# Patient Record
Sex: Female | Born: 1970 | Race: White | Hispanic: No | Marital: Married | Smoking: Never smoker
Health system: Southern US, Community
[De-identification: ages and names within clinical notes are randomized; demographics above are authoritative.]

## PROBLEM LIST (undated history)

## (undated) DIAGNOSIS — I340 Nonrheumatic mitral (valve) insufficiency: Secondary | ICD-10-CM

## (undated) DIAGNOSIS — F419 Anxiety disorder, unspecified: Secondary | ICD-10-CM

## (undated) DIAGNOSIS — E785 Hyperlipidemia, unspecified: Secondary | ICD-10-CM

## (undated) DIAGNOSIS — D0510 Intraductal carcinoma in situ of unspecified breast: Secondary | ICD-10-CM

## (undated) DIAGNOSIS — N8500 Endometrial hyperplasia, unspecified: Secondary | ICD-10-CM

## (undated) DIAGNOSIS — T7840XA Allergy, unspecified, initial encounter: Secondary | ICD-10-CM

## (undated) DIAGNOSIS — R112 Nausea with vomiting, unspecified: Secondary | ICD-10-CM

## (undated) DIAGNOSIS — C50919 Malignant neoplasm of unspecified site of unspecified female breast: Secondary | ICD-10-CM

## (undated) DIAGNOSIS — T4145XA Adverse effect of unspecified anesthetic, initial encounter: Secondary | ICD-10-CM

## (undated) DIAGNOSIS — T8859XA Other complications of anesthesia, initial encounter: Secondary | ICD-10-CM

## (undated) DIAGNOSIS — K219 Gastro-esophageal reflux disease without esophagitis: Secondary | ICD-10-CM

## (undated) DIAGNOSIS — Z9889 Other specified postprocedural states: Secondary | ICD-10-CM

## (undated) DIAGNOSIS — I272 Pulmonary hypertension, unspecified: Secondary | ICD-10-CM

## (undated) DIAGNOSIS — I10 Essential (primary) hypertension: Secondary | ICD-10-CM

## (undated) DIAGNOSIS — K429 Umbilical hernia without obstruction or gangrene: Secondary | ICD-10-CM

## (undated) DIAGNOSIS — Z923 Personal history of irradiation: Secondary | ICD-10-CM

## (undated) HISTORY — DX: Endometrial hyperplasia, unspecified: N85.00

## (undated) HISTORY — DX: Intraductal carcinoma in situ of unspecified breast: D05.10

## (undated) HISTORY — DX: Essential (primary) hypertension: I10

## (undated) HISTORY — DX: Pulmonary hypertension, unspecified: I27.20

## (undated) HISTORY — DX: Umbilical hernia without obstruction or gangrene: K42.9

## (undated) HISTORY — PX: BREAST EXCISIONAL BIOPSY: SUR124

## (undated) HISTORY — DX: Allergy, unspecified, initial encounter: T78.40XA

## (undated) HISTORY — DX: Hyperlipidemia, unspecified: E78.5

## (undated) HISTORY — PX: UMBILICAL HERNIA REPAIR: SHX196

## (undated) HISTORY — DX: Nonrheumatic mitral (valve) insufficiency: I34.0

## (undated) HISTORY — DX: Malignant neoplasm of unspecified site of unspecified female breast: C50.919

---

## 1998-08-27 ENCOUNTER — Inpatient Hospital Stay (HOSPITAL_COMMUNITY): Admission: AD | Admit: 1998-08-27 | Discharge: 1998-08-29 | Payer: Self-pay | Admitting: Obstetrics & Gynecology

## 2001-01-02 ENCOUNTER — Other Ambulatory Visit: Admission: RE | Admit: 2001-01-02 | Discharge: 2001-01-02 | Payer: Self-pay | Admitting: *Deleted

## 2002-01-08 ENCOUNTER — Other Ambulatory Visit: Admission: RE | Admit: 2002-01-08 | Discharge: 2002-01-08 | Payer: Self-pay | Admitting: *Deleted

## 2002-04-14 ENCOUNTER — Encounter: Payer: Self-pay | Admitting: *Deleted

## 2002-04-14 ENCOUNTER — Encounter: Admission: RE | Admit: 2002-04-14 | Discharge: 2002-04-14 | Payer: Self-pay | Admitting: *Deleted

## 2003-02-04 ENCOUNTER — Other Ambulatory Visit: Admission: RE | Admit: 2003-02-04 | Discharge: 2003-02-04 | Payer: Self-pay | Admitting: *Deleted

## 2003-04-30 HISTORY — PX: AUGMENTATION MAMMAPLASTY: SUR837

## 2003-04-30 HISTORY — PX: BREAST ENHANCEMENT SURGERY: SHX7

## 2003-08-03 ENCOUNTER — Encounter: Admission: RE | Admit: 2003-08-03 | Discharge: 2003-08-03 | Payer: Self-pay | Admitting: *Deleted

## 2004-03-02 ENCOUNTER — Other Ambulatory Visit: Admission: RE | Admit: 2004-03-02 | Discharge: 2004-03-02 | Payer: Self-pay | Admitting: Obstetrics and Gynecology

## 2004-11-23 ENCOUNTER — Other Ambulatory Visit: Admission: RE | Admit: 2004-11-23 | Discharge: 2004-11-23 | Payer: Self-pay | Admitting: Obstetrics and Gynecology

## 2005-05-31 ENCOUNTER — Other Ambulatory Visit: Admission: RE | Admit: 2005-05-31 | Discharge: 2005-05-31 | Payer: Self-pay | Admitting: Obstetrics and Gynecology

## 2006-06-16 ENCOUNTER — Encounter: Admission: RE | Admit: 2006-06-16 | Discharge: 2006-06-16 | Payer: Self-pay | Admitting: Obstetrics and Gynecology

## 2007-12-30 ENCOUNTER — Encounter: Admission: RE | Admit: 2007-12-30 | Discharge: 2007-12-30 | Payer: Self-pay | Admitting: Obstetrics and Gynecology

## 2009-03-06 ENCOUNTER — Ambulatory Visit: Payer: Self-pay | Admitting: Family Medicine

## 2009-03-06 DIAGNOSIS — E7800 Pure hypercholesterolemia, unspecified: Secondary | ICD-10-CM | POA: Insufficient documentation

## 2009-03-06 DIAGNOSIS — R5381 Other malaise: Secondary | ICD-10-CM | POA: Insufficient documentation

## 2009-03-06 DIAGNOSIS — K219 Gastro-esophageal reflux disease without esophagitis: Secondary | ICD-10-CM | POA: Insufficient documentation

## 2009-03-06 DIAGNOSIS — R5383 Other fatigue: Secondary | ICD-10-CM

## 2009-03-06 DIAGNOSIS — J069 Acute upper respiratory infection, unspecified: Secondary | ICD-10-CM | POA: Insufficient documentation

## 2009-03-06 DIAGNOSIS — I10 Essential (primary) hypertension: Secondary | ICD-10-CM | POA: Insufficient documentation

## 2009-03-07 ENCOUNTER — Encounter (INDEPENDENT_AMBULATORY_CARE_PROVIDER_SITE_OTHER): Payer: Self-pay | Admitting: *Deleted

## 2009-03-07 LAB — CONVERTED CEMR LAB
ALT: 21 units/L (ref 0–35)
AST: 29 units/L (ref 0–37)
Alkaline Phosphatase: 73 units/L (ref 39–117)
CO2: 28 meq/L (ref 19–32)
Calcium: 9.1 mg/dL (ref 8.4–10.5)
Creatinine, Ser: 0.9 mg/dL (ref 0.4–1.2)
Direct LDL: 129.4 mg/dL
Eosinophils Relative: 1.1 % (ref 0.0–5.0)
GFR calc non Af Amer: 73.51 mL/min (ref >60)
HCT: 39 % (ref 36.0–46.0)
Hemoglobin: 13.2 g/dL (ref 12.0–15.0)
MCV: 92.3 fL (ref 78.0–100.0)
RBC: 4.22 M/uL (ref 3.87–5.11)
Sodium: 141 meq/L (ref 135–145)
Total CHOL/HDL Ratio: 3
WBC: 11.2 10*3/uL — ABNORMAL HIGH (ref 4.5–10.5)

## 2009-04-29 DIAGNOSIS — Z923 Personal history of irradiation: Secondary | ICD-10-CM

## 2009-04-29 DIAGNOSIS — D0510 Intraductal carcinoma in situ of unspecified breast: Secondary | ICD-10-CM

## 2009-04-29 HISTORY — PX: BREAST LUMPECTOMY: SHX2

## 2009-04-29 HISTORY — DX: Personal history of irradiation: Z92.3

## 2009-04-29 HISTORY — DX: Intraductal carcinoma in situ of unspecified breast: D05.10

## 2009-09-01 ENCOUNTER — Encounter: Admission: RE | Admit: 2009-09-01 | Discharge: 2009-09-01 | Payer: Self-pay | Admitting: Obstetrics and Gynecology

## 2009-09-04 ENCOUNTER — Ambulatory Visit: Payer: Self-pay | Admitting: Oncology

## 2009-09-05 ENCOUNTER — Encounter: Payer: Self-pay | Admitting: Family Medicine

## 2009-09-06 LAB — CBC WITH DIFFERENTIAL/PLATELET
LYMPH%: 20.3 % (ref 14.0–49.7)
MCV: 91.4 fL (ref 79.5–101.0)
MONO%: 10.3 % (ref 0.0–14.0)
NEUT%: 65.9 % (ref 38.4–76.8)
RDW: 14 % (ref 11.2–14.5)
lymph#: 1.5 10*3/uL (ref 0.9–3.3)

## 2009-09-08 ENCOUNTER — Encounter: Admission: RE | Admit: 2009-09-08 | Discharge: 2009-09-08 | Payer: Self-pay | Admitting: Obstetrics and Gynecology

## 2009-09-14 ENCOUNTER — Encounter: Admission: RE | Admit: 2009-09-14 | Discharge: 2009-09-14 | Payer: Self-pay | Admitting: Obstetrics and Gynecology

## 2009-09-15 ENCOUNTER — Ambulatory Visit: Payer: Self-pay | Admitting: Family Medicine

## 2009-09-18 LAB — CONVERTED CEMR LAB: Triglycerides: 69 mg/dL (ref 0.0–149.0)

## 2009-09-21 ENCOUNTER — Ambulatory Visit: Payer: Self-pay | Admitting: Family Medicine

## 2009-09-21 DIAGNOSIS — D0590 Unspecified type of carcinoma in situ of unspecified breast: Secondary | ICD-10-CM | POA: Insufficient documentation

## 2009-09-21 DIAGNOSIS — M722 Plantar fascial fibromatosis: Secondary | ICD-10-CM | POA: Insufficient documentation

## 2009-10-09 ENCOUNTER — Encounter: Admission: RE | Admit: 2009-10-09 | Discharge: 2009-10-09 | Payer: Self-pay | Admitting: Obstetrics and Gynecology

## 2009-10-09 ENCOUNTER — Ambulatory Visit (HOSPITAL_BASED_OUTPATIENT_CLINIC_OR_DEPARTMENT_OTHER): Admission: RE | Admit: 2009-10-09 | Discharge: 2009-10-09 | Payer: Self-pay | Admitting: Surgery

## 2009-10-17 ENCOUNTER — Ambulatory Visit: Admission: RE | Admit: 2009-10-17 | Discharge: 2010-01-04 | Payer: Self-pay | Admitting: Radiation Oncology

## 2009-10-18 ENCOUNTER — Encounter: Payer: Self-pay | Admitting: Family Medicine

## 2010-01-03 ENCOUNTER — Encounter: Payer: Self-pay | Admitting: Family Medicine

## 2010-01-17 ENCOUNTER — Ambulatory Visit: Payer: Self-pay | Admitting: Oncology

## 2010-01-19 LAB — CBC WITH DIFFERENTIAL/PLATELET
BASO%: 1.8 % (ref 0.0–2.0)
Basophils Absolute: 0.1 10*3/uL (ref 0.0–0.1)
Eosinophils Absolute: 0.1 10*3/uL (ref 0.0–0.5)
HCT: 38.1 % (ref 34.8–46.6)
HGB: 12.6 g/dL (ref 11.6–15.9)
LYMPH%: 13.5 % — ABNORMAL LOW (ref 14.0–49.7)
MCH: 30 pg (ref 25.1–34.0)
MCV: 90.8 fL (ref 79.5–101.0)
MONO#: 0.6 10*3/uL (ref 0.1–0.9)
MONO%: 14.5 % — ABNORMAL HIGH (ref 0.0–14.0)
NEUT%: 67.9 % (ref 38.4–76.8)
Platelets: 192 10*3/uL (ref 145–400)
RBC: 4.19 10*6/uL (ref 3.70–5.45)
lymph#: 0.5 10*3/uL — ABNORMAL LOW (ref 0.9–3.3)

## 2010-04-20 ENCOUNTER — Encounter: Payer: Self-pay | Admitting: Family Medicine

## 2010-04-27 ENCOUNTER — Ambulatory Visit: Payer: Self-pay | Admitting: Oncology

## 2010-05-04 LAB — COMPREHENSIVE METABOLIC PANEL
ALT: 19 U/L (ref 0–35)
AST: 22 U/L (ref 0–37)
Albumin: 3.7 g/dL (ref 3.5–5.2)
Alkaline Phosphatase: 55 U/L (ref 39–117)
BUN: 12 mg/dL (ref 6–23)
CO2: 29 mEq/L (ref 19–32)
Calcium: 9.1 mg/dL (ref 8.4–10.5)
Chloride: 105 mEq/L (ref 96–112)
Creatinine, Ser: 0.9 mg/dL (ref 0.40–1.20)
Glucose, Bld: 98 mg/dL (ref 70–99)
Potassium: 4.1 mEq/L (ref 3.5–5.3)
Sodium: 141 mEq/L (ref 135–145)
Total Bilirubin: 0.3 mg/dL (ref 0.3–1.2)
Total Protein: 6.7 g/dL (ref 6.0–8.3)

## 2010-05-04 LAB — CBC WITH DIFFERENTIAL/PLATELET
BASO%: 0.6 % (ref 0.0–2.0)
Basophils Absolute: 0 10*3/uL (ref 0.0–0.1)
EOS%: 1.4 % (ref 0.0–7.0)
Eosinophils Absolute: 0.1 10*3/uL (ref 0.0–0.5)
HCT: 36.8 % (ref 34.8–46.6)
HGB: 12.5 g/dL (ref 11.6–15.9)
LYMPH%: 18.3 % (ref 14.0–49.7)
MCH: 31.2 pg (ref 25.1–34.0)
MCHC: 33.8 g/dL (ref 31.5–36.0)
MCV: 92.2 fL (ref 79.5–101.0)
MONO#: 0.4 10*3/uL (ref 0.1–0.9)
MONO%: 7.3 % (ref 0.0–14.0)
NEUT#: 4.4 10*3/uL (ref 1.5–6.5)
NEUT%: 72.4 % (ref 38.4–76.8)
Platelets: 160 10*3/uL (ref 145–400)
RBC: 3.99 10*6/uL (ref 3.70–5.45)
RDW: 13.6 % (ref 11.2–14.5)
WBC: 6.1 10*3/uL (ref 3.9–10.3)
lymph#: 1.1 10*3/uL (ref 0.9–3.3)

## 2010-05-31 NOTE — Letter (Signed)
Summary: [facility]   Imported By: Lanelle Bal 10/03/2009 10:41:55  _____________________________________________________________________  External Attachment:    Type:   Image     Comment:   External Document

## 2010-05-31 NOTE — Letter (Signed)
Summary: [facility]  [facility]   Imported By: Maryln Gottron 01/26/2010 12:40:36  _____________________________________________________________________  External Attachment:    Type:   Image     Comment:   External Document

## 2010-05-31 NOTE — Letter (Signed)
Summary: [facility]   Imported By: Lanelle Bal 05/15/2010 08:19:07  _____________________________________________________________________  External Attachment:    Type:   Image     Comment:   External Document

## 2010-05-31 NOTE — Letter (Signed)
[facility]   Imported By: Maryln Gottron 11/14/2009 15:49:56  _____________________________________________________________________  External Attachment:    Type:   Image     Comment:   External Document

## 2010-05-31 NOTE — Assessment & Plan Note (Signed)
Summary: follow up bp and chol/hmw   Vital Signs:  Patient profile:   40 year old female Height:      67 inches Weight:      166.6 pounds BMI:     26.19 Temp:     97.8 degrees F oral Pulse rate:   80 / minute Pulse rhythm:   regular BP sitting:   120 / 80  (left arm) Cuff size:   regular  Vitals Entered By: Benny Lennert CMA Duncan Dull) (Sep 21, 2009 8:36 AM)  History of Present Illness: Chief complaint follow up bp and cholesterol  HTN, stable  Chol, labs reviewed  BRCA:  Mammogram, breast center.  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  Doing the gene testing.  Seeing Dr. Ezzard Standing and  ALready has seen them.   The patient presents with a multimonth long history of heel pain. This is notable for worsening pain first thing in the morning when arising and standing after sitting.   Prior foot or ankle fractures: none Prior operations: none Orthotics or bracing: none PT or home rehab: none Night splints: no Ice massage: no Ball massage: no  Metatarsal pain: no   Allergies: 1)  ! Penicillin 2)  ! Sulfa 3)  ! Sulfa  Past History:  Past medical, surgical, family and social histories (including risk factors) reviewed, and no changes noted (except as noted below).  Past Medical History: HYPERCHOLESTEROLEMIA GERD Hypertension Breast Cancer, DCIS  Past Surgical History: Reviewed history from 03/06/2009 and no changes required. child birth 1997&2000  Family History: Reviewed history from 03/06/2009 and no changes required. Family History Breast cancer 1st degree relative <50 Family History High cholesterol Family History Hypertension Family History of Stroke F 1st degree relative <60 Family History of Stroke M 1st degree relative <50 Family History of Cardiovascular disorder  Social History: Reviewed history from 03/06/2009 and no changes required. Occupation: Dental hygenist Married, 2 children 13, 10 Never Smoked Alcohol use-yes,  occ Drug use-no Regular exercise-yes  Review of Systems      See HPI General:  Denies chills, fatigue, and fever.  Physical Exam  Additional Exam:  GEN: WDWN, NAD, Non-toxic, A & O x 3 HEENT: Atraumatic, Normocephalic. Neck supple. No masses, No LAD. Ears and Nose: No external deformity. CV: RRR, No M/G/R. No JVD. No thrill. No extra heart sounds. PULM: CTA B, no wheezes, crackles, rhonchi. No retractions. No resp. distress. No accessory muscle use. EXTR: No c/c/e NEURO: Normal gait.  PSYCH: Normally interactive. Conversant. Not depressed or anxious appearing.  Calm demeanor.    Echymosis: no Edema: no ROM: full LE B Gait: heel toe, non-antalgic MT pain: no Callus pattern: none Lateral Mall: NT Medial Mall: NT Talus: NT Navicular: NT Calcaneous: NT Metatarsals: NT 5th MT: NT Phalanges: NT Achilles: NT Plantar Fascia: tender, medial along PF. Pain with forced dorsi Fat Pad: NT Peroneals: NT Post Tib: NT Great Toe: Nml motion Ant Drawer: neg Other foot breakdown: none Long arch: preserved Transverse arch: preserved Hindfoot breakdown: none Sensation: intact      Impression & Recommendations:  Problem # 1:  HYPERCHOLESTEROLEMIA (ICD-272.0) Assessment Improved  Labs Reviewed: SGOT: 29 (03/06/2009)   SGPT: 21 (03/06/2009)   HDL:89.30 (09/15/2009), 94.70 (03/06/2009)  Chol:230 (09/15/2009), 241 (03/06/2009)  Trig:69.0 (09/15/2009), 90.0 (03/06/2009)  Problem # 2:  HYPERTENSION (ICD-401.9)  Her updated medication list for this problem includes:    Lisinopril 10 Mg Tabs (Lisinopril) .Marland Kitchen... 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 Reviewed footwear. Rigid soles have been shown to help with  PF. Reviewed rehab of stretching and calf raises.   Complete Medication List: 1)  Lisinopril 10 Mg Tabs (Lisinopril) .... Take 1 tab by mouth daily  Patient Instructions: 1)  Please read handouts from AAOSM and American Academy of Foot and Ankle Surgeons on Plantar Fascitis. 2)  STRETCHING and Strengthening program critically important. 3)  Strengthening on foot and calf muscles as seen in handout. 4)  Calf raises, 2 legged, then 1 legged. 5)  Foot massage with tennis ball. 6)  Ice massage. 7)  NEEDS TO BE DONE EVERY DAY 8)  Recommended over the counter insoles. (Spenco or Hapad) 9)  A rigid shoe with good arch support helps: Dansko (great), Randel Pigg, Merrell 10)  No easily bendable shoes.    Prescriptions: LISINOPRIL 10 MG  TABS (LISINOPRIL) Take 1 tab by mouth daily  #30 x 11   Entered and Authorized by:   [name] MD   Signed by:   [name] MD on 09/21/2009   Method used:   Print then Give to Patient   RxID:   [id number]   Current Allergies (reviewed today): ! PENICILLIN ! SULFA ! SULFA

## 2010-06-20 ENCOUNTER — Other Ambulatory Visit: Payer: Self-pay | Admitting: Obstetrics and Gynecology

## 2010-06-20 DIAGNOSIS — Z9889 Other specified postprocedural states: Secondary | ICD-10-CM

## 2010-06-25 ENCOUNTER — Ambulatory Visit (INDEPENDENT_AMBULATORY_CARE_PROVIDER_SITE_OTHER): Payer: BC Managed Care – PPO | Admitting: Sports Medicine

## 2010-06-25 ENCOUNTER — Encounter: Payer: Self-pay | Admitting: Sports Medicine

## 2010-06-25 DIAGNOSIS — M79609 Pain in unspecified limb: Secondary | ICD-10-CM

## 2010-06-25 DIAGNOSIS — M722 Plantar fascial fibromatosis: Secondary | ICD-10-CM

## 2010-07-05 NOTE — Assessment & Plan Note (Signed)
Summary: NP,PF,MC [phone]   Vital Signs:  Patient profile:   40 year old female Height:      66.5 inches Weight:      165 pounds BMI:     26.33 Pulse rate:   68 / minute BP sitting:   121 / 80  (right arm)  Vitals Entered By: Rochele Pages RN (June 25, 2010 11:18 AM) CC: bilat PF   CC:  bilat PF.  History of Present Illness: New pt that presents to clinic for evaluation of PF which she has experienced for greater than 1 yr.  Heel pain- worse in mornings and after rest.  Has discussed with Dr Patsy Lager who is PCP- he suggested icing, and stretching exercises- which she has done regularly.   Also went to [employer] and purchased orthotics there. Hard ones are not that comfortable but arch support helps. Has seen podiatrist, Dr. Celene Skeen, who did bilateral cortisone injections twice- states this was helpful initially, but pain returned after 1 month    Likes to walk for exercise, but unable to do this due to worsening PF pain.  Uses elliptical, this does not cause much heel pain.  recovering from breast cancer/ still on tamoxifen  Preventive Screening-Counseling & Management  Alcohol-Tobacco     Smoking Status: never  Allergies: 1)  ! Penicillin 2)  ! Sulfa 3)  ! Sulfa  Physical Exam  General:  Well-developed,well-nourished,in no acute distress; alert,appropriate and cooperative throughout examination Msk:  normal appearing arches  small morton's change bilat  TTP at rt heel insertion of PF - moderate only mild TTP at left PF  great toe movement is good  walking gait to me looks normal   Impression & Recommendations:  Problem # 1:  HEEL PAIN, BILATERAL (ICD-729.5)  This is worse on RT but has been chronic on both feet  wants to keep walking program  trying to get healthy and get weight down after breast CA and other issues  given green sports insoles w heel pad and scaphoid pad added  at that point these were helpful in pain reduction  walking  gait is normal  Orders: Sports Insoles (L3510)  Problem # 2:  PLANTAR FASCIITIS (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 Take 1 tab by mouth daily   Orders Added: 1)  New Patient Level II [99202] 2)  Sports Insoles [L3510]

## 2010-07-11 ENCOUNTER — Encounter: Payer: Self-pay | Admitting: *Deleted

## 2010-07-16 LAB — DIFFERENTIAL
Basophils Relative: 1 % (ref 0–1)
Eosinophils Relative: 3 % (ref 0–5)
Lymphocytes Relative: 28 % (ref 12–46)
Lymphs Abs: 1.7 10*3/uL (ref 0.7–4.0)
Monocytes Absolute: 0.5 10*3/uL (ref 0.1–1.0)
Neutro Abs: 3.5 10*3/uL (ref 1.7–7.7)
Neutrophils Relative %: 55 % (ref 43–77)
Neutrophils Relative %: 61 % (ref 43–77)
Smear Review: ADEQUATE

## 2010-07-16 LAB — COMPREHENSIVE METABOLIC PANEL
Alkaline Phosphatase: 75 U/L (ref 39–117)
BUN: 11 mg/dL (ref 6–23)
CO2: 28 mEq/L (ref 19–32)
Calcium: 8.9 mg/dL (ref 8.4–10.5)
Chloride: 106 mEq/L (ref 96–112)
GFR calc Af Amer: >60 mL/min (ref >60)
GFR calc non Af Amer: >60 mL/min (ref >60)
Glucose, Bld: 88 mg/dL (ref 70–99)
Potassium: 4 mEq/L (ref 3.5–5.1)
Sodium: 138 mEq/L (ref 135–145)
Total Protein: 6.4 g/dL (ref 6.0–8.3)

## 2010-07-16 LAB — CBC
Hemoglobin: 12.6 g/dL (ref 12.0–15.0)
MCHC: 33.5 g/dL (ref 30.0–36.0)
MCHC: 33.8 g/dL (ref 30.0–36.0)
RDW: 12.9 % (ref 11.5–15.5)
RDW: 13 % (ref 11.5–15.5)
WBC: 5.9 10*3/uL (ref 4.0–10.5)

## 2010-07-16 LAB — CANCER ANTIGEN 27.29: CA 27.29: 14 U/mL (ref 0–39)

## 2010-07-16 LAB — PLATELET COUNT

## 2010-08-09 ENCOUNTER — Ambulatory Visit: Payer: BC Managed Care – PPO | Admitting: Radiation Oncology

## 2010-08-13 ENCOUNTER — Ambulatory Visit: Payer: BC Managed Care – PPO | Attending: Radiation Oncology | Admitting: Radiation Oncology

## 2010-09-05 ENCOUNTER — Encounter (INDEPENDENT_AMBULATORY_CARE_PROVIDER_SITE_OTHER): Payer: Self-pay | Admitting: Surgery

## 2010-09-07 ENCOUNTER — Ambulatory Visit
Admission: RE | Admit: 2010-09-07 | Discharge: 2010-09-07 | Disposition: A | Discharge status: Home / Self Care | Payer: BC Managed Care – PPO | Source: Ambulatory Visit | Attending: Obstetrics and Gynecology | Admitting: Obstetrics and Gynecology

## 2010-09-07 DIAGNOSIS — Z9889 Other specified postprocedural states: Secondary | ICD-10-CM

## 2010-09-10 ENCOUNTER — Other Ambulatory Visit: Payer: Self-pay | Admitting: Surgery

## 2010-09-10 DIAGNOSIS — Z9889 Other specified postprocedural states: Secondary | ICD-10-CM

## 2010-09-27 ENCOUNTER — Ambulatory Visit (INDEPENDENT_AMBULATORY_CARE_PROVIDER_SITE_OTHER): Payer: BC Managed Care – PPO | Admitting: Family Medicine

## 2010-09-27 ENCOUNTER — Encounter: Payer: Self-pay | Admitting: Family Medicine

## 2010-09-27 VITALS — BP 102/60 | HR 72 | Temp 97.9°F | Ht 66.75 in | Wt 159.0 lb

## 2010-09-27 DIAGNOSIS — I10 Essential (primary) hypertension: Secondary | ICD-10-CM

## 2010-09-27 DIAGNOSIS — D0590 Unspecified type of carcinoma in situ of unspecified breast: Secondary | ICD-10-CM

## 2010-09-27 DIAGNOSIS — E7800 Pure hypercholesterolemia, unspecified: Secondary | ICD-10-CM

## 2010-09-27 MED ORDER — LISINOPRIL 10 MG PO TABS: 10.0000 mg | ORAL_TABLET | Freq: Every day | ORAL | Status: DC

## 2010-09-27 NOTE — Progress Notes (Signed)
40 year old female:  S/p lumpectomy for DCIS and on Tamoxifen doing well.  HTN: Tolerating all medications without side effects Stable and at goal No CP, no sob. No HA.  BP Readings from Last 3 Encounters:  09/27/10 102/60  06/25/10 121/80  09/21/09 120/80   2. Hyperlipidemia: off all meds now, some weight gain while undergoing cancer treatment.  Patient Active Problem List  Diagnoses  . DUCTAL CARCINOMA IN SITU, BREAST  . HYPERCHOLESTEROLEMIA  . HYPERTENSION  . URI  . GERD  . PLANTAR FASCIITIS  . FATIGUE  . HEEL PAIN, BILATERAL   Past Medical History  Diagnosis Date  . Ductal carcinoma in situ of breast   . Hyperlipidemia   . Hypertension    Past Surgical History  Procedure Date  . Breast surgery 2005    implants  . Breast lumpectomy 2011   History  Substance Use Topics  . Smoking status: Never Smoker   . Smokeless tobacco: Not on file  . Alcohol Use: Yes   Family History  Problem Relation Age of Onset  . Cancer Mother     Breast  . Stroke Mother   . Cancer Father    Allergies  Allergen Reactions  . Codeine   . Penicillins   . Sulfonamide Derivatives    Current Outpatient Prescriptions on File Prior to Visit  Medication Sig Dispense Refill  . DISCONTD: lisinopril (PRINIVIL,ZESTRIL) 10 MG tablet 1/2 tablet by mouth daily       ROS: GEN: No acute illnesses, no fevers, chills. GI: No n/v/d, eating normally Pulm: No SOB Interactive and getting along well at home.  Otherwise, ROS is as per the HPI.   Physical Exam  Blood pressure 102/60, pulse 72, temperature 97.9 F (36.6 C), temperature source Oral, height 5' 6.75" (1.695 m), weight 159 lb (72.122 kg), SpO2 99.00%.  GEN: WDWN, NAD, Non-toxic, A & O x 3 HEENT: Atraumatic, Normocephalic. Neck supple. No masses, No LAD. Ears and Nose: No external deformity. CV: RRR, No M/G/R. No JVD. No thrill. No extra heart sounds. PULM: CTA B, no wheezes, crackles, rhonchi. No retractions. No resp.  distress. No accessory muscle use. EXTR: No c/c/e NEURO Normal gait.  PSYCH: Normally interactive. Conversant. Not depressed or anxious appearing.  Calm demeanor.   A/P: HTN, stable, refill ACE 2. Lipids: check FLP

## 2010-09-27 NOTE — Patient Instructions (Signed)
F/u for labs at her convenience

## 2010-09-28 ENCOUNTER — Other Ambulatory Visit (INDEPENDENT_AMBULATORY_CARE_PROVIDER_SITE_OTHER): Payer: BC Managed Care – PPO | Admitting: Family Medicine

## 2010-09-28 DIAGNOSIS — E7800 Pure hypercholesterolemia, unspecified: Secondary | ICD-10-CM

## 2010-09-28 LAB — LIPID PANEL
LDL Cholesterol: 88 mg/dL (ref 0–99)
Total CHOL/HDL Ratio: 3

## 2010-10-01 ENCOUNTER — Encounter: Payer: Self-pay | Admitting: *Deleted

## 2010-10-12 ENCOUNTER — Ambulatory Visit
Admission: RE | Admit: 2010-10-12 | Discharge: 2010-10-12 | Disposition: A | Discharge status: Home / Self Care | Payer: BC Managed Care – PPO | Source: Ambulatory Visit | Attending: Surgery | Admitting: Surgery

## 2010-10-12 ENCOUNTER — Other Ambulatory Visit: Payer: BC Managed Care – PPO

## 2010-10-12 DIAGNOSIS — Z9889 Other specified postprocedural states: Secondary | ICD-10-CM

## 2010-10-12 MED ORDER — GADOBENATE DIMEGLUMINE 529 MG/ML IV SOLN
15.0000 mL | Freq: Once | INTRAVENOUS | Status: AC | PRN
  Administered 2010-10-12: 15 mL via INTRAVENOUS

## 2010-11-16 ENCOUNTER — Other Ambulatory Visit: Payer: Self-pay | Admitting: Oncology

## 2010-11-16 ENCOUNTER — Encounter (HOSPITAL_BASED_OUTPATIENT_CLINIC_OR_DEPARTMENT_OTHER): Payer: BC Managed Care – PPO | Admitting: Oncology

## 2010-11-16 DIAGNOSIS — C50419 Malignant neoplasm of upper-outer quadrant of unspecified female breast: Secondary | ICD-10-CM

## 2010-11-16 DIAGNOSIS — D0590 Unspecified type of carcinoma in situ of unspecified breast: Secondary | ICD-10-CM

## 2010-11-16 LAB — CBC WITH DIFFERENTIAL/PLATELET
Basophils Absolute: 0.1 10*3/uL (ref 0.0–0.1)
EOS%: 2.4 % (ref 0.0–7.0)
Eosinophils Absolute: 0.1 10*3/uL (ref 0.0–0.5)
HCT: 38.3 % (ref 34.8–46.6)
HGB: 12.5 g/dL (ref 11.6–15.9)
MONO#: 0.4 10*3/uL (ref 0.1–0.9)
NEUT#: 2.3 10*3/uL (ref 1.5–6.5)
RDW: 12.8 % (ref 11.2–14.5)
WBC: 4.1 10*3/uL (ref 3.9–10.3)
lymph#: 1.3 10*3/uL (ref 0.9–3.3)

## 2010-11-17 LAB — VITAMIN D 25 HYDROXY (VIT D DEFICIENCY, FRACTURES): Vit D, 25-Hydroxy: 56 ng/mL (ref 30–89)

## 2010-11-17 LAB — COMPREHENSIVE METABOLIC PANEL
Alkaline Phosphatase: 63 U/L (ref 39–117)
BUN: 12 mg/dL (ref 6–23)
Glucose, Bld: 92 mg/dL (ref 70–99)
Sodium: 140 mEq/L (ref 135–145)
Total Bilirubin: 0.4 mg/dL (ref 0.3–1.2)
Total Protein: 6.6 g/dL (ref 6.0–8.3)

## 2010-11-23 ENCOUNTER — Encounter (HOSPITAL_BASED_OUTPATIENT_CLINIC_OR_DEPARTMENT_OTHER): Payer: BC Managed Care – PPO | Admitting: Oncology

## 2011-02-21 ENCOUNTER — Ambulatory Visit (INDEPENDENT_AMBULATORY_CARE_PROVIDER_SITE_OTHER): Payer: BC Managed Care – PPO | Admitting: Family Medicine

## 2011-02-21 ENCOUNTER — Ambulatory Visit: Payer: BC Managed Care – PPO | Admitting: Family Medicine

## 2011-02-21 ENCOUNTER — Encounter: Payer: Self-pay | Admitting: Family Medicine

## 2011-02-21 ENCOUNTER — Other Ambulatory Visit: Payer: Self-pay | Admitting: Family Medicine

## 2011-02-21 VITALS — BP 120/74 | HR 64 | Temp 98.4°F | Ht 66.75 in | Wt 164.5 lb

## 2011-02-21 DIAGNOSIS — R7989 Other specified abnormal findings of blood chemistry: Secondary | ICD-10-CM

## 2011-02-21 DIAGNOSIS — F411 Generalized anxiety disorder: Secondary | ICD-10-CM

## 2011-02-21 DIAGNOSIS — F419 Anxiety disorder, unspecified: Secondary | ICD-10-CM

## 2011-02-21 DIAGNOSIS — R002 Palpitations: Secondary | ICD-10-CM

## 2011-02-21 DIAGNOSIS — R0602 Shortness of breath: Secondary | ICD-10-CM

## 2011-02-21 LAB — D-DIMER, QUANTITATIVE: D-Dimer, Quant: 0.5 ug/mL-FEU — ABNORMAL HIGH (ref 0.00–0.48)

## 2011-02-21 NOTE — Patient Instructions (Signed)
Good to see you. I will call you with your lab work tomorrow morning. Try taking your Xanax if symptoms return.

## 2011-02-21 NOTE — Progress Notes (Signed)
40 year old very pleasant pt of Dr. Patsy Lager here for SOB x 1.5 weeks.    S/p lumpectomy for DCIS and on Tamoxifen and had been doing well. Past 1.5 weeks, increased awareness of her breathing, not necessary worsened by exertion. Feels like her chest is sore when she takes deep breaths. No recent URI symptoms. No diaphoresis or dizziness.    She is under increased family stress currently and not sure if this is related to anxiety.  Has as needed Xanax which she tries not to use regularly but did seem to help a little with her symptoms yesterday.  Does have FH of DVT.  Patient Active Problem List  Diagnoses  . DUCTAL CARCINOMA IN SITU, BREAST  . HYPERCHOLESTEROLEMIA  . HYPERTENSION  . GERD  . FATIGUE  . Shortness of breath  . Anxiety   Past Medical History  Diagnosis Date  . Ductal carcinoma in situ of breast   . Hyperlipidemia   . Hypertension    Past Surgical History  Procedure Date  . Breast surgery 2005    implants  . Breast lumpectomy 2011   History  Substance Use Topics  . Smoking status: Never Smoker   . Smokeless tobacco: Not on file  . Alcohol Use: Yes   Family History  Problem Relation Age of Onset  . Cancer Mother     Breast  . Stroke Mother   . Cancer Father    Allergies  Allergen Reactions  . Codeine   . Penicillins   . Sulfonamide Derivatives    Current Outpatient Prescriptions on File Prior to Visit  Medication Sig Dispense Refill  . lisinopril (PRINIVIL,ZESTRIL) 10 MG tablet Take 1 tablet (10 mg total) by mouth daily. 1/2 tablet by mouth daily  30 tablet  11  . tamoxifen (NOLVADEX) 20 MG tablet        ROS: GEN: No acute illnesses, no fevers, chills.  Otherwise, ROS is as per the HPI.   Physical Exam  Blood pressure 120/74, pulse 64, temperature 98.4 F (36.9 C), temperature source Oral, height 5' 6.75" (1.695 m), weight 164 lb 8 oz (74.617 kg).  GEN: WDWN, NAD, Non-toxic, A & O x 3 HEENT: Atraumatic, Normocephalic. Neck supple. No  masses, No LAD. Ears and Nose: No external deformity. CV: RRR, No M/G/R. No JVD. No thrill. No extra heart sounds. PULM: CTA B, no wheezes, crackles, rhonchi. No retractions. No resp. distress. No accessory muscle use. EXTR: No c/c/e NEURO Normal gait.  PSYCH: Normally interactive. Conversant. Not depressed or anxious appearing.  Calm demeanor.   EKG- sinus bradycardia, otherwise unremarkable.  No arrythmia.  Assessment and Plan:  1. Shortness of breath  D-Dimer, Quantitative, CBC w/Diff, TSH, T4, free  New.  ?possible anxiety or panic attacks given her current stressors. She is at risk for DVT given h/o malignancy, tamoxifen use and family history. EKG unremarkable and no abnormalities on exam. Will check D-dimer, CBC, TSH, FT4.   Advised to take xanax as needed as it does seem to help. Keep [place] posted with symptoms. The patient indicates understanding of these issues and agrees with the plan.

## 2011-02-22 ENCOUNTER — Telehealth: Payer: Self-pay | Admitting: Radiology

## 2011-02-22 ENCOUNTER — Ambulatory Visit (INDEPENDENT_AMBULATORY_CARE_PROVIDER_SITE_OTHER)
Admission: RE | Admit: 2011-02-22 | Discharge: 2011-02-22 | Disposition: A | Discharge status: Home / Self Care | Payer: BC Managed Care – PPO | Source: Ambulatory Visit | Attending: Internal Medicine | Admitting: Internal Medicine

## 2011-02-22 ENCOUNTER — Telehealth: Payer: Self-pay | Admitting: *Deleted

## 2011-02-22 DIAGNOSIS — R7989 Other specified abnormal findings of blood chemistry: Secondary | ICD-10-CM

## 2011-02-22 DIAGNOSIS — R791 Abnormal coagulation profile: Secondary | ICD-10-CM

## 2011-02-22 LAB — CBC WITH DIFFERENTIAL/PLATELET
Basophils Absolute: 0.1 10*3/uL (ref 0.0–0.1)
Eosinophils Absolute: 0.1 10*3/uL (ref 0.0–0.7)
Lymphocytes Relative: 28.7 % (ref 12.0–46.0)
MCHC: 33.9 g/dL (ref 30.0–36.0)
MCV: 92.6 fl (ref 78.0–100.0)
Neutrophils Relative %: 56.1 % (ref 43.0–77.0)
WBC: 5.3 10*3/uL (ref 4.5–10.5)

## 2011-02-22 LAB — T4, FREE: Free T4: 0.9 ng/dL (ref 0.60–1.60)

## 2011-02-22 MED ORDER — IOHEXOL 300 MG/ML  SOLN
80.0000 mL | Freq: Once | INTRAMUSCULAR | Status: AC | PRN
  Administered 2011-02-22: 80 mL via INTRAVENOUS

## 2011-02-22 NOTE — Telephone Encounter (Signed)
[facility] called to say the plateletcount was off on the patients CBC, they need a fresh tube of blood to do asap for manuel diff. I told them to release the rest of her labs, holding the platelet count.

## 2011-02-22 NOTE — Telephone Encounter (Signed)
CT: negative for PE, exam negative.  Patient was notified and sent home.  Verbal report given to Dr. Dayton Martes.

## 2011-02-22 NOTE — Telephone Encounter (Signed)
[facility] called to say the plateletcount was off on the patients CBC, they need a fresh tube of blood to do asap for manuel diff. I told them to release the rest of her labs, holding the platelet count.   

## 2011-02-25 MED ORDER — ALPRAZOLAM 0.25 MG PO TABS: 0.2500 mg | ORAL_TABLET | Freq: Every evening | ORAL | Status: DC | PRN

## 2011-02-25 NOTE — Telephone Encounter (Signed)
Noted  

## 2011-02-25 NOTE — Progress Notes (Signed)
Addended by: Dianne Dun on: 02/25/2011 07:15 AM   Modules accepted: Orders

## 2011-04-04 ENCOUNTER — Telehealth: Payer: Self-pay | Admitting: *Deleted

## 2011-04-04 NOTE — Telephone Encounter (Signed)
patient confirmed over the phone the new date and time on 06-14-2011 starting at 9:00am

## 2011-04-11 ENCOUNTER — Encounter (INDEPENDENT_AMBULATORY_CARE_PROVIDER_SITE_OTHER): Payer: Self-pay | Admitting: Surgery

## 2011-05-31 ENCOUNTER — Encounter (INDEPENDENT_AMBULATORY_CARE_PROVIDER_SITE_OTHER): Payer: Self-pay | Admitting: Surgery

## 2011-05-31 ENCOUNTER — Ambulatory Visit (INDEPENDENT_AMBULATORY_CARE_PROVIDER_SITE_OTHER): Payer: BC Managed Care – PPO | Admitting: Surgery

## 2011-05-31 VITALS — BP 120/86 | HR 68 | Temp 98.4°F | Resp 16 | Ht 66.5 in | Wt 158.8 lb

## 2011-05-31 DIAGNOSIS — D0590 Unspecified type of carcinoma in situ of unspecified breast: Secondary | ICD-10-CM

## 2011-05-31 NOTE — Progress Notes (Addendum)
[facility]  Ovidio Kin, MD,  FACS [address] [place]  [address] [place], [place]    [address] Phone:  [phone] FAX:  [phone]   Re:   SHATERIA PATERNOSTRO DOB:   November 19, 1970 MRN:   [id number]  ASSESSMENT AND PLAN: 1.  DCIS right breast.  Lumpectomy - 10/09/2009.  ER - 99%, PR - 98%  Sees Dr. Mervin Hack and Dr. Lyda Jester.  On tamoxifen.  Doing well without symptoms.  Disease free.  Will see me back in 6 months.  At that point I can alternate with her seeing Dr. Donnie Coffin.  2.  Bilateral implants, in [place] - 2005. 3.  Hypertension. On Lisinopril.  HISTORY OF PRESENT ILLNESS: Chief Complaint  Patient presents with  . Breast Cancer Long Term Follow Up    rt breast ck    Cassandra Mccann is a 41 y.o. (DOB: [birth year])  white female who is a patient of [name], MD, MD and comes to me today for follow up of right breast cancer.  She sees Dr. Martina Sinner from GYN standpoint.  She is doing well.  No specific complaint.  Early after surgery, she had some tingling in her right breast, but this has resolved.  Her last imaging was bilateral MRI's in June 2012, which were negative.  She also got worked up of a PE when she had some chest pain (date unknown), but that was negative.  PHYSICAL EXAM: BP 120/86  Pulse 68  Temp(Src) 98.4 F (36.9 C) (Temporal)  Resp 16  Ht 5' 6.5" (1.689 m)  Wt 158 lb 12.8 oz (72.031 kg)  BMI 25.25 kg/m2  HEENT:  Pupils equal.  Dentition good.  No injury. NECK:  Supple.  No thyroid mass. LYMPH NODES:  No cervical, supraclavicular, or axillary adenopathy. BREASTS -  RIGHT:  Has bilateral implants. No palpable mass or nodule.  No nipple discharge.   LEFT:  No palpable mass or nodule.  No nipple discharge. UPPER EXTREMITIES:  No evidence of lymphedema.  DATA REVIEWED: MRI - 10/12/2010 - negative.  Ovidio Kin, MD, FACS Office:  [phone]

## 2011-06-14 ENCOUNTER — Telehealth: Payer: Self-pay | Admitting: *Deleted

## 2011-06-14 ENCOUNTER — Ambulatory Visit (HOSPITAL_BASED_OUTPATIENT_CLINIC_OR_DEPARTMENT_OTHER): Payer: BC Managed Care – PPO | Admitting: Oncology

## 2011-06-14 ENCOUNTER — Other Ambulatory Visit (HOSPITAL_BASED_OUTPATIENT_CLINIC_OR_DEPARTMENT_OTHER): Payer: BC Managed Care – PPO | Admitting: Lab

## 2011-06-14 VITALS — BP 124/72 | HR 67 | Temp 98.7°F | Ht 66.5 in | Wt 152.1 lb

## 2011-06-14 DIAGNOSIS — Z79810 Long term (current) use of selective estrogen receptor modulators (SERMs): Secondary | ICD-10-CM

## 2011-06-14 DIAGNOSIS — D0590 Unspecified type of carcinoma in situ of unspecified breast: Secondary | ICD-10-CM

## 2011-06-14 DIAGNOSIS — Z170 Estrogen receptor positive status [ER+]: Secondary | ICD-10-CM

## 2011-06-14 LAB — CBC WITH DIFFERENTIAL/PLATELET
BASO%: 2.5 % — ABNORMAL HIGH (ref 0.0–2.0)
MCHC: 33.5 g/dL (ref 31.5–36.0)
MONO#: 0.5 10*3/uL (ref 0.1–0.9)
RBC: 4.39 10*6/uL (ref 3.70–5.45)
RDW: 14.1 % (ref 11.2–14.5)
WBC: 4 10*3/uL (ref 3.9–10.3)
lymph#: 1.1 10*3/uL (ref 0.9–3.3)
nRBC: 0 % (ref 0–0)

## 2011-06-14 LAB — COMPREHENSIVE METABOLIC PANEL
Alkaline Phosphatase: 61 U/L (ref 39–117)
BUN: 11 mg/dL (ref 6–23)
Creatinine, Ser: 1.03 mg/dL (ref 0.50–1.10)
Glucose, Bld: 66 mg/dL — ABNORMAL LOW (ref 70–99)
Sodium: 139 mEq/L (ref 135–145)
Total Bilirubin: 0.3 mg/dL (ref 0.3–1.2)

## 2011-06-14 LAB — VITAMIN D 25 HYDROXY (VIT D DEFICIENCY, FRACTURES): Vit D, 25-Hydroxy: 57 ng/mL (ref 30–89)

## 2011-06-14 NOTE — Telephone Encounter (Signed)
left voice mail message to inform the patient of his appointment for 06-14-2011

## 2011-06-14 NOTE — Progress Notes (Signed)
Hematology and Oncology Follow Up Visit  Cassandra Mccann [id number] 1971-01-28 41 y.o. 06/14/2011 10:25 AM PCP Dr Ruthe Mannan Principle Diagnosis: 41 year old woman history of DCIS, status post lumpectomy 09/01/2009 completion of radiation 01/03/2010, ER PR positive on tamoxifen.  Interim History:  There have been no intercurrent illness, hospitalizations or medication changes. Patient is doing well. She did have an episode of shortness of breath a few months ago and was worked up for PE which was negative. She otherwise is to return to baseline.  Medications: I have reviewed the patient's current medications.  Allergies:  Allergies  Allergen Reactions  . Codeine   . Penicillins   . Sulfonamide Derivatives     Past Medical History, Surgical history, Social history, and Family History were reviewed and updated.  Review of Systems: Constitutional:  Negative for fever, chills, night sweats, anorexia, weight loss, pain. Cardiovascular: no chest pain or dyspnea on exertion Respiratory: no cough, shortness of breath, or wheezing Neurological: negative Dermatological: negative ENT: negative Skin Gastrointestinal: no abdominal pain, change in bowel habits, or black or bloody stools Genito-Urinary: negative Hematological and Lymphatic: negative Breast: negative Musculoskeletal: negative Remaining ROS negative.  Physical Exam: Blood pressure 124/72, pulse 67, temperature 98.7 F (37.1 C), temperature source Oral, height 5' 6.5" (1.689 m), weight 152 lb 1.6 oz (68.992 kg). ECOG:  General appearance: alert, cooperative and appears stated age Head: Normocephalic, without obvious abnormality, atraumatic Neck: no adenopathy, no carotid bruit, no JVD, supple, symmetrical, trachea midline and thyroid not enlarged, symmetric, no tenderness/mass/nodules Lymph nodes: Cervical, supraclavicular, and axillary nodes normal. Cardiac : regular rate and rhythm, no murmurs or gallops Pulmonary:clear to  auscultation bilaterally and normal percussion bilaterally Breasts: inspection negative, no nipple discharge or bleeding, no masses or nodularity palpable, status post bilateral implants. She has some scattered palpable, acneform-type areas on her chest wall. These are nonpruritic the pruritic. I have recommended dermatological followup.  Abdomen:soft, non-tender; bowel sounds normal; no masses,  no organomegaly Extremities negative Neuro: alert, oriented, normal speech, no focal findings or movement disorder noted  Lab Results: Lab Results  Component Value Date   WBC 4.0 06/14/2011   HGB 13.0 06/14/2011   HCT 38.8 06/14/2011   MCV 88.4 06/14/2011   PLT Clumped Platelets--Appears Adequate 06/14/2011     Chemistry      Component Value Date/Time   NA 140 11/16/2010 1400   NA 140 11/16/2010 1400   NA 140 11/16/2010 1400   K 4.1 11/16/2010 1400   K 4.1 11/16/2010 1400   K 4.1 11/16/2010 1400   CL 108 11/16/2010 1400   CL 108 11/16/2010 1400   CL 108 11/16/2010 1400   CO2 24 11/16/2010 1400   CO2 24 11/16/2010 1400   CO2 24 11/16/2010 1400   BUN 12 11/16/2010 1400   BUN 12 11/16/2010 1400   BUN 12 11/16/2010 1400   CREATININE 1.00 11/16/2010 1400   CREATININE 1.00 11/16/2010 1400   CREATININE 1.00 11/16/2010 1400      Component Value Date/Time   CALCIUM 9.3 11/16/2010 1400   CALCIUM 9.3 11/16/2010 1400   CALCIUM 9.3 11/16/2010 1400   ALKPHOS 63 11/16/2010 1400   ALKPHOS 63 11/16/2010 1400   ALKPHOS 63 11/16/2010 1400   AST 17 11/16/2010 1400   AST 17 11/16/2010 1400   AST 17 11/16/2010 1400   ALT 12 11/16/2010 1400   ALT 12 11/16/2010 1400   ALT 12 11/16/2010 1400   BILITOT 0.4 11/16/2010 1400   BILITOT 0.4 11/16/2010  1400   BILITOT 0.4 11/16/2010 1400      .pathology. Radiological Studies: chest X-ray NA Mammogram Due in May Bone density NA  Impression and Plan: Patient is doing well. No clinical evidence of recurrence. Labs are within normal limits. I will see her in 6 months time for  followup. Her shooting for a mammogram at that time.  More than 50% of the visit was spent in patient-related counselling   Pierce Crane, MD 2/15/201310:25 AM

## 2011-09-11 ENCOUNTER — Other Ambulatory Visit: Payer: Self-pay | Admitting: *Deleted

## 2011-09-11 DIAGNOSIS — D0590 Unspecified type of carcinoma in situ of unspecified breast: Secondary | ICD-10-CM

## 2011-09-11 MED ORDER — TAMOXIFEN CITRATE 20 MG PO TABS: 20.0000 mg | ORAL_TABLET | Freq: Every day | ORAL | Status: DC

## 2011-09-13 ENCOUNTER — Ambulatory Visit
Admission: RE | Admit: 2011-09-13 | Discharge: 2011-09-13 | Disposition: A | Discharge status: Home / Self Care | Payer: BC Managed Care – PPO | Source: Ambulatory Visit | Attending: Oncology | Admitting: Oncology

## 2011-09-13 DIAGNOSIS — D0590 Unspecified type of carcinoma in situ of unspecified breast: Secondary | ICD-10-CM

## 2011-09-27 ENCOUNTER — Other Ambulatory Visit: Payer: Self-pay

## 2011-09-27 NOTE — Telephone Encounter (Signed)
[facility] faxed refill request Alprazolam. Last filled 02/25/11.Please advise.

## 2011-09-28 NOTE — Telephone Encounter (Signed)
Ok to refill #30, 0 refills 

## 2011-09-30 MED ORDER — ALPRAZOLAM 0.25 MG PO TABS: 0.2500 mg | ORAL_TABLET | Freq: Every evening | ORAL | Status: DC | PRN

## 2011-09-30 NOTE — Telephone Encounter (Signed)
rx called to pharmacy 

## 2011-11-22 ENCOUNTER — Other Ambulatory Visit: Payer: Self-pay | Admitting: *Deleted

## 2011-11-22 DIAGNOSIS — D0590 Unspecified type of carcinoma in situ of unspecified breast: Secondary | ICD-10-CM

## 2011-11-22 MED ORDER — TAMOXIFEN CITRATE 20 MG PO TABS: 20.0000 mg | ORAL_TABLET | Freq: Every day | ORAL | Status: DC

## 2011-12-16 ENCOUNTER — Telehealth: Payer: Self-pay | Admitting: *Deleted

## 2011-12-20 ENCOUNTER — Ambulatory Visit: Payer: BC Managed Care – PPO | Admitting: Oncology

## 2011-12-20 ENCOUNTER — Other Ambulatory Visit: Payer: BC Managed Care – PPO | Admitting: Lab

## 2011-12-20 ENCOUNTER — Ambulatory Visit (INDEPENDENT_AMBULATORY_CARE_PROVIDER_SITE_OTHER): Payer: BC Managed Care – PPO | Admitting: Surgery

## 2012-01-07 ENCOUNTER — Other Ambulatory Visit: Payer: Self-pay

## 2012-01-07 NOTE — Telephone Encounter (Signed)
Pt request Lisinopril 10 mg refil to [facility]; pts instruction on med list are take 1 tab daily and 1/2 tab daily. Spoke with pt; she takes 1/2 tab daily; she has already scheduled CPX on 03/18/12. Please advise.

## 2012-01-08 MED ORDER — LISINOPRIL 10 MG PO TABS: ORAL_TABLET | ORAL | Status: DC

## 2012-01-08 NOTE — Telephone Encounter (Signed)
Change to 1/2 tablet daily and refill, 15, 5 refills

## 2012-01-08 NOTE — Telephone Encounter (Signed)
Rx sent to pharmacy , directions changed .

## 2012-01-30 ENCOUNTER — Ambulatory Visit (INDEPENDENT_AMBULATORY_CARE_PROVIDER_SITE_OTHER): Payer: BC Managed Care – PPO | Admitting: Surgery

## 2012-01-30 ENCOUNTER — Encounter (INDEPENDENT_AMBULATORY_CARE_PROVIDER_SITE_OTHER): Payer: Self-pay | Admitting: Surgery

## 2012-01-30 VITALS — BP 126/80 | HR 85 | Temp 98.9°F | Resp 18 | Ht 66.0 in | Wt 161.0 lb

## 2012-01-30 DIAGNOSIS — D0590 Unspecified type of carcinoma in situ of unspecified breast: Secondary | ICD-10-CM

## 2012-01-30 NOTE — Progress Notes (Signed)
[facility]  Ovidio Kin, MD,  FACS [address] [place]  [address] [place], [place]    [address] Phone:  [phone] FAX:  [phone]   Re:   Cassandra Mccann DOB:   08-02-1970 MRN:   [id number]  ASSESSMENT AND PLAN: 1.  DCIS right breast.  10 o'clock. (Tis, N0)  Lumpectomy - 10/09/2009.  ER - 99%, PR - 98%  Sees Dr. Mervin Hack and Dr. Lyda Jester.  On tamoxifen.  Doing well without symptoms.  Disease free.    I will see her in one year and I will alternate q 6 months with Dr. Donnie Coffin.  2.  Bilateral implants, in [place] - 2005. 3.  Hypertension. On Lisinopril.  HISTORY OF PRESENT ILLNESS: Chief Complaint  Patient presents with  . Follow-up    Rt Breast    Cassandra Mccann is a 41 y.o. (DOB: 1971/01/02)  white female who is a patient of [name], MD and comes to me today for follow up of right breast cancer.  She sees Dr. Martina Sinner from GYN standpoint.  She is doing well.  No new complaints or concerns.  She is not good about checking her breasts.  We talked a little about 3D mammography which is new in the field of radiology.  Social History: She works as a Armed forces operational officer. We talked about 3D dental images and the incidental findings.  PHYSICAL EXAM: BP 126/80  Pulse 85  Temp 98.9 F (37.2 C) (Oral)  Resp 18  Ht 5\' 6"  (1.676 m)  Wt 161 lb (73.029 kg)  BMI 25.99 kg/m2  HEENT:  Pupils equal.  Dentition good.   NECK:  Supple.  No thyroid mass. LYMPH NODES:  No cervical, supraclavicular, or axillary adenopathy. BREASTS -  RIGHT:  Has bilateral implants. No palpable mass or nodule.  No nipple discharge.   LEFT:  Breast implants.  No palpable mass or nodule.  No nipple discharge. UPPER EXTREMITIES:  No evidence of lymphedema.  DATA REVIEWED: MRI - 09/13/2011 - negative.  Ovidio Kin, MD, FACS Office:  [phone]

## 2012-02-20 ENCOUNTER — Other Ambulatory Visit: Payer: Self-pay | Admitting: *Deleted

## 2012-02-20 DIAGNOSIS — D0590 Unspecified type of carcinoma in situ of unspecified breast: Secondary | ICD-10-CM

## 2012-02-21 ENCOUNTER — Telehealth: Payer: Self-pay | Admitting: *Deleted

## 2012-02-21 ENCOUNTER — Other Ambulatory Visit (HOSPITAL_BASED_OUTPATIENT_CLINIC_OR_DEPARTMENT_OTHER): Payer: BC Managed Care – PPO | Admitting: Lab

## 2012-02-21 ENCOUNTER — Other Ambulatory Visit: Payer: Self-pay | Admitting: *Deleted

## 2012-02-21 ENCOUNTER — Ambulatory Visit (HOSPITAL_BASED_OUTPATIENT_CLINIC_OR_DEPARTMENT_OTHER): Payer: BC Managed Care – PPO | Admitting: Oncology

## 2012-02-21 VITALS — BP 138/76 | HR 81 | Temp 98.6°F | Resp 20 | Ht 66.0 in | Wt 166.3 lb

## 2012-02-21 DIAGNOSIS — D0590 Unspecified type of carcinoma in situ of unspecified breast: Secondary | ICD-10-CM

## 2012-02-21 DIAGNOSIS — Z170 Estrogen receptor positive status [ER+]: Secondary | ICD-10-CM

## 2012-02-21 LAB — COMPREHENSIVE METABOLIC PANEL (CC13)
Albumin: 4 g/dL (ref 3.5–5.0)
BUN: 13 mg/dL (ref 7.0–26.0)
Calcium: 9.8 mg/dL (ref 8.4–10.4)
Chloride: 105 mEq/L (ref 98–107)
Glucose: 73 mg/dl (ref 70–99)
Potassium: 4.2 mEq/L (ref 3.5–5.1)

## 2012-02-21 LAB — CBC WITH DIFFERENTIAL/PLATELET
Basophils Absolute: 0.2 10*3/uL — ABNORMAL HIGH (ref 0.0–0.1)
Eosinophils Absolute: 0.1 10*3/uL (ref 0.0–0.5)
HCT: 39 % (ref 34.8–46.6)
HGB: 12.9 g/dL (ref 11.6–15.9)
MCV: 91.1 fL (ref 79.5–101.0)
NEUT#: 4.2 10*3/uL (ref 1.5–6.5)
NEUT%: 61.7 % (ref 38.4–76.8)
RDW: 13.7 % (ref 11.2–14.5)
lymph#: 1.6 10*3/uL (ref 0.9–3.3)

## 2012-02-21 LAB — LACTATE DEHYDROGENASE (CC13): LDH: 197 U/L (ref 125–220)

## 2012-02-21 LAB — CANCER ANTIGEN 27.29: CA 27.29: 13 U/mL (ref 0–39)

## 2012-02-21 NOTE — Telephone Encounter (Signed)
Gave patient appointment for mammogram on 08-2012 at the [facility]  Gave patient appointment for lab and md in 2014

## 2012-02-21 NOTE — Progress Notes (Signed)
Hematology and Oncology Follow Up Visit  Cassandra Mccann [id number] [birth year] 41 y.o. 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  Interim History:  Patient returns for followup. She sees her Careers adviser as well. She's been therapy well. She has minimal short-term. She thinks is maybe related to taking tamoxifen. Her menses are regular. She had her last mammogram in may. She lives no complaints. Appetite good weight is stable she continues to work full-time. Medications: I have reviewed the patient's current medications.  Allergies:  Allergies  Allergen Reactions  . Codeine   . Penicillins   . Sulfonamide Derivatives     Past Medical History, Surgical history, Social history, and Family History were reviewed and updated.  Review of Systems: Constitutional:  Negative for fever, chills, night sweats, anorexia, weight loss, pain. Cardiovascular: no chest pain or dyspnea on exertion Respiratory: no cough, shortness of breath, or wheezing Neurological: negative Dermatological: negative ENT: negative Skin Gastrointestinal: no abdominal pain, change in bowel habits, or black or bloody stools Genito-Urinary: negative Hematological and Lymphatic: negative Breast: negative Musculoskeletal: negative Remaining ROS negative.  Physical Exam: Blood pressure 138/76, pulse 81, temperature 98.6 F (37 C), resp. rate 20, height 5\' 6"  (1.676 m), weight 166 lb 4.8 oz (75.433 kg). ECOG: 0 General appearance: alert, cooperative and appears stated age Head: Normocephalic, without obvious abnormality, atraumatic Neck: no adenopathy, no carotid bruit, no JVD, supple, symmetrical, trachea midline and thyroid not enlarged, symmetric, no tenderness/mass/nodules Lymph nodes: Cervical, supraclavicular, and axillary nodes normal. Cardiac : regular rate and rhythm, no  murmurs or gallops Pulmonary:clear to auscultation bilaterally and normal percussion bilaterally Breasts: inspection negative, no nipple discharge or bleeding, no masses or nodularity palpable, status post bilateral implants. She has some scattered palpable, acneform-type areas on her chest wall. These are nonpruritic the pruritic. I have recommended dermatological followup.  Abdomen:soft, non-tender; bowel sounds normal; no masses,  no organomegaly Extremities negative Neuro: alert, oriented, normal speech, no focal findings or movement disorder noted  Lab Results: Lab Results  Component Value Date   WBC 6.7 02/21/2012   HGB 12.9 02/21/2012   HCT 39.0 02/21/2012   MCV 91.1 02/21/2012   PLT 171 02/21/2012     Chemistry      Component Value Date/Time   NA 140 02/21/2012 1358   NA 139 06/14/2011 0912   K 4.2 02/21/2012 1358   K 5.3 06/14/2011 0912   CL 105 02/21/2012 1358   CL 107 06/14/2011 0912   CO2 25 02/21/2012 1358   CO2 23 06/14/2011 0912   BUN 13.0 02/21/2012 1358   BUN 11 06/14/2011 0912   CREATININE 0.9 02/21/2012 1358   CREATININE 1.03 06/14/2011 0912      Component Value Date/Time   CALCIUM 9.8 02/21/2012 1358   CALCIUM 9.1 06/14/2011 0912   ALKPHOS 76 02/21/2012 1358   ALKPHOS 61 06/14/2011 0912   AST 21 02/21/2012 1358   AST 26 06/14/2011 0912   ALT 17 02/21/2012 1358   ALT 21 06/14/2011 0912   BILITOT 0.30 02/21/2012 1358   BILITOT 0.3 06/14/2011 0912      .pathology. Radiological Studies: chest X-ray NA Mammogram Due in May Bone density NA  Impression and Plan: Patient is doing well. No clinical evidence of recurrence. Labs are within normal limits. I will see her in 12 months time for followup. Her mammogram will be in may of next  year. We will try alternate visits with her surgeon.  More than 50% of the visit was spent in patient-related counselling   Pierce Crane, MD 10/25/20133:19 PM

## 2012-03-18 ENCOUNTER — Ambulatory Visit (INDEPENDENT_AMBULATORY_CARE_PROVIDER_SITE_OTHER): Payer: BC Managed Care – PPO | Admitting: Family Medicine

## 2012-03-18 ENCOUNTER — Encounter: Payer: Self-pay | Admitting: Family Medicine

## 2012-03-18 VITALS — BP 110/70 | HR 65 | Temp 98.6°F | Ht 66.0 in | Wt 168.2 lb

## 2012-03-18 DIAGNOSIS — R5381 Other malaise: Secondary | ICD-10-CM

## 2012-03-18 DIAGNOSIS — Z0000 Encounter for general adult medical examination without abnormal findings: Secondary | ICD-10-CM

## 2012-03-18 DIAGNOSIS — E785 Hyperlipidemia, unspecified: Secondary | ICD-10-CM

## 2012-03-18 DIAGNOSIS — Z23 Encounter for immunization: Secondary | ICD-10-CM

## 2012-03-18 DIAGNOSIS — R5383 Other fatigue: Secondary | ICD-10-CM

## 2012-03-18 LAB — LIPID PANEL
HDL: 86.4 mg/dL (ref >39.00)
Total CHOL/HDL Ratio: 2
VLDL: 13.8 mg/dL (ref 0.0–40.0)

## 2012-03-18 LAB — TSH: TSH: 0.97 u[IU]/mL (ref 0.35–5.50)

## 2012-03-18 LAB — LDL CHOLESTEROL, DIRECT: Direct LDL: 102.7 mg/dL

## 2012-03-18 NOTE — Progress Notes (Signed)
Nature conservation officer at [facility] [address] [place] [address] Phone: [phone] Fax: [phone]  Date:  03/18/2012   Name:  Cassandra Mccann   DOB:  [birth year]   MRN:  [id number] Gender: female Age: 41 y.o.  PCP:  Cassandra Beat, MD  Evaluating MD: Cassandra Beat, MD   Chief Complaint: Annual Exam   History of Present Illness:  Cassandra Mccann is a 41 y.o. pleasant patient who presents with the following:  CPX:  Flu shot today DCIS, disease free GYN - does paps and breast  Left leg will not go well all that much, does have some lateral, left hip pain. Some L GTB  Trainer, once a week.   Health Maintenance Summary Reviewed and updated, unless pt declines services.  Tobacco History Reviewed. Non-smoker Alcohol: No concerns, no excessive use Exercise Habits: Some activity, rec at least 30 mins 5 times a week - now about 1 time a week, much of the time less than previously STD concerns: none Drug Use: None Lumps or breast concerns: gyn and onc f/u  Health Maintenance  Topic Date Due  . Tetanus/tdap  08/30/1987  . Mammogram  09/12/2012  . Influenza Vaccine  12/28/2012  . Pap Smear  03/20/2015    Labs reviewed with the patient.  Results for orders placed in visit on 03/18/12  LIPID PANEL      Component Value Range   Cholesterol 213 (*) 0 - 200 mg/dL   Triglycerides 46.2  0.0 - 149.0 mg/dL   HDL 70.35  >00.93 mg/dL   VLDL 81.8  0.0 - 29.9 mg/dL   Total CHOL/HDL Ratio 2    TSH      Component Value Range   TSH 0.97  0.35 - 5.50 uIU/mL  LDL CHOLESTEROL, DIRECT      Component Value Range   Direct LDL 102.7     Basic Metabolic Panel:    Component Value Date/Time   NA 140 02/21/2012 1358   NA 139 06/14/2011 0912   K 4.2 02/21/2012 1358   K 5.3 06/14/2011 0912   CL 105 02/21/2012 1358   CL 107 06/14/2011 0912   CO2 25 02/21/2012 1358   CO2 23 06/14/2011 0912   BUN 13.0 02/21/2012 1358   BUN 11 06/14/2011 0912   CREATININE 0.9 02/21/2012 1358   CREATININE 1.03 06/14/2011 0912   GLUCOSE 73 02/21/2012 1358   GLUCOSE 66* 06/14/2011 0912   CALCIUM 9.8 02/21/2012 1358   CALCIUM 9.1 06/14/2011 0912   liver function test  Lab Results  Component Value Date   ALT 17 02/21/2012   AST 21 02/21/2012   ALKPHOS 76 02/21/2012   BILITOT 0.30 02/21/2012     Patient Active Problem List  Diagnosis  . 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  Marland Kitchen HYPERCHOLESTEROLEMIA  . HYPERTENSION  . GERD  . FATIGUE  . Anxiety    Past Medical History  Diagnosis Date  . Ductal carcinoma in situ of breast   . Hyperlipidemia   . Hypertension   . Cancer     Past Surgical History  Procedure Date  . Breast surgery 2005    implants  . Breast lumpectomy 2011    History  Substance Use Topics  . Smoking status: Never Smoker   . Smokeless tobacco: Not on file  . Alcohol Use: Yes    Family History  Problem Relation Age of Onset  . Cancer Mother     Breast  .  Stroke Mother   . Hypertension Mother   . Cancer Father     bladder    Allergies  Allergen Reactions  . Codeine   . Penicillins   . Sulfonamide Derivatives     Medication list has been reviewed and updated.  Outpatient Prescriptions Prior to Visit  Medication Sig Dispense Refill  . ALPRAZolam (XANAX) 0.25 MG tablet Take 1 tablet (0.25 mg total) by mouth at bedtime as needed.  30 tablet  0  . lisinopril (PRINIVIL,ZESTRIL) 10 MG tablet 1/2 tablet by mouth daily  15 tablet  5  . tamoxifen (NOLVADEX) 20 MG tablet Take 1 tablet (20 mg total) by mouth daily.  30 tablet  1   Last reviewed on 03/18/2012  8:32 AM by Consuello Masse, CMA  Review of Systems:   General: Denies fever, chills, sweats. No significant weight loss. Eyes: Denies blurring,significant itching ENT: Denies earache, sore throat, and hoarseness.  Cardiovascular: Denies chest pains, palpitations, dyspnea on exertion,  Respiratory: Denies cough, dyspnea at rest,wheeezing Breast: no  concerns about lumps GI: Denies nausea, vomiting, diarrhea, constipation, change in bowel habits, abdominal pain, melena, hematochezia GU: Denies dysuria, hematuria, urinary hesitancy, nocturia, denies STD risk, no concerns about discharge Musculoskeletal: Denies back pain, joint pain - lateral hip pain Derm: Denies rash, itching Neuro: Denies  paresthesias, frequent falls, frequent headaches Psych: Denies depression, anxiety Endocrine: Denies cold intolerance, heat intolerance, polydipsia Heme: Denies enlarged lymph nodes Allergy: No hayfever   Physical Examination: Filed Vitals:   03/18/12 0832  BP: 110/70  Pulse: 65  Temp: 98.6 F (37 C)  TempSrc: Oral  Height: 5\' 6"  (1.676 m)  Weight: 168 lb 4 oz (76.318 kg)  SpO2: 97%    Body mass index is 27.16 kg/(m^2). Ideal Body Weight: Weight in (lb) to have BMI = 25: 154.6    GEN: well developed, well nourished, no acute distress Eyes: conjunctiva and lids normal, PERRLA, EOMI ENT: TM clear, nares clear, oral exam WNL Neck: supple, no lymphadenopathy, no thyromegaly, no JVD Pulm: clear to auscultation and percussion, respiratory effort normal CV: regular rate and rhythm, S1-S2, no murmur, rub or gallop, no bruits Chest: no scars, masses, no lumps BREAST: breast exam declined GI: soft, non-tender; no hepatosplenomegaly, masses; active bowel sounds all quadrants GU: GU exam declined Lymph: no cervical, axillary or inguinal adenopathy MSK:   HIP EXAM: SIDE: B ROM: Abduction, Flexion, Internal and External range of motion: full Pain with terminal IROM and EROM: none GTB: L GTB mild - mod tender SLR: NEG Knees: No effusion FABER: NT REVERSE FABER: NT, neg Piriformis: NT at direct palpation Str: flexion: 5/5 abduction: 5/5, 4++/5 on the L adduction: 5/5 Strength testing non-tender  SKIN: clear, good turgor, color WNL, no rashes, lesions, or ulcerations Neuro: normal mental status, normal strength, sensation, and  motion Psych: alert; oriented to person, place and time, normally interactive and not anxious or depressed in appearance.    Assessment and Plan:  1. Routine general medical examination at a health care facility    2. Need for prophylactic vaccination and inoculation against influenza  Flu vaccine greater than or equal to 3yo preservative free IM  3. Other and unspecified hyperlipidemia  Lipid panel  4. Other malaise and fatigue  TSH   The patient's preventative maintenance and recommended screening tests for an annual wellness exam were reviewed in full today. Brought up to date unless services declined.  Counselled on the importance of diet, exercise, and its role in overall  health and mortality. The patient's FH and SH was reviewed, including their home life, tobacco status, and drug and alcohol status.   Overall, doing well. Work on getting back in shape Reviewed hip rehab If not improving in a couple of months, could inject gtb  Orders Today:  Orders Placed This Encounter  Procedures  . Flu vaccine greater than or equal to 3yo preservative free IM  . Lipid panel  . TSH  . LDL cholesterol, direct    Updated Medication List: (Includes new medications, updates to list, dose adjustments) No orders of the defined types were placed in this encounter.    Medications Discontinued: There are no discontinued medications.   Cassandra Beat, MD

## 2012-03-19 ENCOUNTER — Encounter: Payer: Self-pay | Admitting: *Deleted

## 2012-03-25 ENCOUNTER — Other Ambulatory Visit: Payer: Self-pay | Admitting: *Deleted

## 2012-03-25 DIAGNOSIS — D0590 Unspecified type of carcinoma in situ of unspecified breast: Secondary | ICD-10-CM

## 2012-03-25 MED ORDER — TAMOXIFEN CITRATE 20 MG PO TABS: 20.0000 mg | ORAL_TABLET | Freq: Every day | ORAL | Status: DC

## 2012-06-27 DIAGNOSIS — N8500 Endometrial hyperplasia, unspecified: Secondary | ICD-10-CM

## 2012-06-27 DIAGNOSIS — K429 Umbilical hernia without obstruction or gangrene: Secondary | ICD-10-CM

## 2012-06-27 HISTORY — DX: Endometrial hyperplasia, unspecified: N85.00

## 2012-06-27 HISTORY — DX: Umbilical hernia without obstruction or gangrene: K42.9

## 2012-06-29 ENCOUNTER — Encounter (HOSPITAL_COMMUNITY): Payer: Self-pay | Admitting: Pharmacist

## 2012-07-02 ENCOUNTER — Ambulatory Visit (INDEPENDENT_AMBULATORY_CARE_PROVIDER_SITE_OTHER): Payer: BC Managed Care – PPO | Admitting: Family Medicine

## 2012-07-02 ENCOUNTER — Encounter: Payer: Self-pay | Admitting: Family Medicine

## 2012-07-02 VITALS — BP 110/70 | HR 65 | Temp 98.4°F | Ht 66.0 in | Wt 172.5 lb

## 2012-07-02 DIAGNOSIS — J029 Acute pharyngitis, unspecified: Secondary | ICD-10-CM

## 2012-07-02 LAB — POCT RAPID STREP A (OFFICE): Rapid Strep A Screen: NEGATIVE

## 2012-07-02 NOTE — Progress Notes (Signed)
Nature conservation officer at [facility] [address] [place] [address] Phone: [phone] Fax: [phone]  Date:  07/02/2012   Name:  Cassandra Mccann   DOB:  1970-07-27   MRN:  [id number] Gender: female Age: 42 y.o.  Primary Physician:  [name], MD  Evaluating MD: [name], MD   Chief Complaint: Sore Throat   History of Present Illness:  Cassandra Mccann is a 42 y.o. pleasant patient who presents with the following:  Sore throat yesterday and today, burning and hurting. Then started to get sick with throwing up and diarrhea. Throat is still hurting but everything else better. No significant cough. No UTI sx, no n/v/d.  Patient Active Problem List  Diagnosis  . 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  Marland Kitchen HYPERCHOLESTEROLEMIA  . HYPERTENSION  . GERD  . FATIGUE  . Anxiety    Past Medical History  Diagnosis Date  . Ductal carcinoma in situ of breast   . Hyperlipidemia   . Hypertension   . Cancer     Past Surgical History  Procedure Laterality Date  . Breast surgery  2005    implants  . Breast lumpectomy  2011    History   Social History  . Marital Status: Married    Spouse Name: N/A    Number of Children: N/A  . Years of Education: N/A   Occupational History  . Not on file.   Social History Main Topics  . Smoking status: Never Smoker   . Smokeless tobacco: Not on file  . Alcohol Use: Yes  . Drug Use: No  . Sexually Active: Not on file   Other Topics Concern  . Not on file   Social History Narrative  . No narrative on file    Family History  Problem Relation Age of Onset  . Cancer Mother     Breast  . Stroke Mother   . Hypertension Mother   . Cancer Father     bladder    Allergies  Allergen Reactions  . Codeine Other (See Comments)    Unknown. Can take Percocet.  Marland Kitchen Penicillins Other (See Comments)    Unknown childhood reaction  . Sulfonamide Derivatives Nausea And Vomiting    Medication list  has been reviewed and updated.  Outpatient Prescriptions Prior to Visit  Medication Sig Dispense Refill  . lisinopril (PRINIVIL,ZESTRIL) 10 MG tablet 1/2 tablet by mouth daily  15 tablet  5  . tamoxifen (NOLVADEX) 20 MG tablet Take 1 tablet (20 mg total) by mouth daily.  30 tablet  11  . ALPRAZolam (XANAX) 0.25 MG tablet Take 0.25 mg by mouth daily as needed for anxiety.      . chlorpheniramine (CHLOR-TRIMETON) 4 MG tablet Take 4 mg by mouth 2 (two) times daily as needed for allergies.      Marland Kitchen ibuprofen (ADVIL,MOTRIN) 200 MG tablet Take 800 mg by mouth every 8 (eight) hours as needed for pain.       No facility-administered medications prior to visit.    Review of Systems:  ROS: GEN: Acute illness details above GI: Tolerating PO intake GU: maintaining adequate hydration and urination Pulm: No SOB Interactive and getting along well at home.  Otherwise, ROS is as per the HPI.   Physical Examination: BP 110/70  Pulse 65  Temp(Src) 98.4 F (36.9 C) (Oral)  Ht 5\' 6"  (1.676 m)  Wt 172 lb 8 oz (78.245 kg)  BMI 27.86 kg/m2  SpO2 98%  Ideal Body Weight: Weight in (lb) to have BMI = 25: 154.6   GEN: WDWN, NAD, Non-toxic, A & O x 3 HEENT: Atraumatic, Normocephalic. Neck supple. No masses, No LAD. Oropharynx clear.  Ears and Nose: No external deformity. CV: RRR, No M/G/R. No JVD. No thrill. No extra heart sounds. PULM: CTA B, no wheezes, crackles, rhonchi. No retractions. No resp. distress. No accessory muscle use. EXTR: No c/c/e NEURO Normal gait.  PSYCH: Normally interactive. Conversant. Not depressed or anxious appearing.  Calm demeanor.    Assessment and Plan: Viral pharyngitis  Sore throat - Plan: POCT rapid strep A   Suspect viral sore throat Has some allergies, so advised zyrtec or claritin Reflux, can continue prilosec  Results for orders placed in visit on 07/02/12  POCT RAPID STREP A (OFFICE)      Result Value Range   Rapid Strep A Screen Negative  Negative      Signed, Spencer T. Copland, MD 07/02/2012 11:07 AM

## 2012-07-06 NOTE — H&P (Signed)
Cassandra Mccann  DICTATION # [id number] CSN# [id number]   Meriel Pica, MD 07/06/2012 4:28 PM

## 2012-07-08 NOTE — H&P (Signed)
NAMECHARMAGNE, BUHL                   ACCOUNT NO.:  [id number]  MEDICAL RECORD NO.:  [id number]  LOCATION:                                 FACILITY:  PHYSICIAN:  Duke Salvia. Marcelle Overlie, M.D.DATE OF BIRTH:  [birth year]  DATE OF ADMISSION:  07/13/2012 DATE OF DISCHARGE:                             HISTORY & PHYSICAL   CHIEF COMPLAINT:  Leiomyoma, abnormal bleeding, history of breast cancer, currently on tamoxifen.  HISTORY OF PRESENT ILLNESS:  A 42 year old G2, P2.  Has a 44 and 44 year old.  Her husband has had a vasectomy.  Had noticed some brownish irregular bleeding recently.  She is currently on tamoxifen as part of her breast cancer management.  FHT was done in the office that showed multiple small leiomyomas 1.9, 1.3, 1.8 with the endometrium 13 mm. Discussed a number of options with her today including D and C, hysteroscopy versus plus-minus ablation or endometrial biopsy followed by Mirena IUD.  She has a strong preference for LAVH.  Her BRCA testing was negative.  Both adnexae appeared to be normal on ultrasound dated June 12, 2012.  This procedure including specific risks related to bleeding, infection, adjacent organ injury, transfusion, possible need for open or additional surgery along with her expected recovery time reviewed, which she understands and accepts.  PAST MEDICAL HISTORY:  CURRENT MEDICATIONS:  Tamoxifen, lisinopril.  OTHER SURGERIES:  She has had vaginal delivery in 2000 and 1997, breast augmentation, she has had prior LEEP.  REVIEW OF SYSTEMS:  Significant for history of abnormal Pap smear, treated in the past, hiatal hernia, breast cancer.  FAMILY HISTORY:  Significant for headache, heart disease, ulcer disease, and bladder and bone cancer in her family members.  SOCIAL HISTORY:  Denies drug or tobacco use.  She does drink 2-3 alcohol drinks per week.  She is married.  Dr. Patsy Lager is her medical doctor.  PHYSICAL EXAMINATION:  VITAL SIGNS:  Temp  98.2, blood pressure 138/82. HEENT:  Unremarkable. NECK:  Supple without masses. LUNGS:  Clear. CARDIOVASCULAR:  Regular rate and rhythm without murmurs, rubs, or gallops. BREASTS:  Without masses.  She does have implants. ABDOMEN:  Soft, flat, nontender. GU:  Vulva, vagina, and cervix normal.  Uterus was 6 weeks' size mobile. Adnexa negative. EXTREMITIES:  Unremarkable. NEUROLOGIC:  Unremarkable.  IMPRESSION: 1. History of breast cancer.  Currently on tamoxifen. 2. Leiomyoma with abnormal bleeding.  PLAN:  LAVH.  Procedure and risks discussed as above.     Richard M. Marcelle Overlie, M.D.     RM[facility]/MEDQ  D:  07/06/2012  T:  07/07/2012  Job:  [id number]

## 2012-07-10 ENCOUNTER — Other Ambulatory Visit: Payer: Self-pay

## 2012-07-10 ENCOUNTER — Encounter (HOSPITAL_COMMUNITY): Payer: Self-pay

## 2012-07-10 ENCOUNTER — Encounter (HOSPITAL_COMMUNITY)
Admission: RE | Admit: 2012-07-10 | Discharge: 2012-07-10 | Disposition: A | Discharge status: Home / Self Care | Payer: BC Managed Care – PPO | Source: Ambulatory Visit | Attending: Obstetrics and Gynecology | Admitting: Obstetrics and Gynecology

## 2012-07-10 DIAGNOSIS — N938 Other specified abnormal uterine and vaginal bleeding: Secondary | ICD-10-CM | POA: Diagnosis present

## 2012-07-10 DIAGNOSIS — Z853 Personal history of malignant neoplasm of breast: Secondary | ICD-10-CM | POA: Diagnosis not present

## 2012-07-10 DIAGNOSIS — N949 Unspecified condition associated with female genital organs and menstrual cycle: Secondary | ICD-10-CM | POA: Diagnosis present

## 2012-07-10 DIAGNOSIS — D252 Subserosal leiomyoma of uterus: Secondary | ICD-10-CM | POA: Diagnosis not present

## 2012-07-10 HISTORY — DX: Gastro-esophageal reflux disease without esophagitis: K21.9

## 2012-07-10 LAB — SURGICAL PCR SCREEN
MRSA, PCR: NEGATIVE
Staphylococcus aureus: NEGATIVE

## 2012-07-10 LAB — CBC
Platelets: UNDETERMINED 10*3/uL (ref 150–400)
RBC: 4.36 MIL/uL (ref 3.87–5.11)
RDW: 13.6 % (ref 11.5–15.5)
WBC: 7.1 10*3/uL (ref 4.0–10.5)

## 2012-07-10 LAB — BASIC METABOLIC PANEL
CO2: 24 mEq/L (ref 19–32)
Calcium: 9.1 mg/dL (ref 8.4–10.5)
GFR calc Af Amer: >90 mL/min (ref >90)
GFR calc non Af Amer: 85 mL/min — ABNORMAL LOW (ref >90)
Sodium: 140 mEq/L (ref 135–145)

## 2012-07-10 NOTE — Patient Instructions (Addendum)
Your procedure is scheduled on:07/13/12  Enter through the Main Entrance at : 6am Pick up desk phone and dial [phone] and inform us of your arrival.  Please call [phone] if you have any problems the morning of surgery.  Remember: Do not eat or drink after midnight:Sunday   Take these meds the morning of surgery with a sip of water: blood pressure med  DO NOT wear jewelry, eye make-up, lipstick,body lotion, or dark fingernail polish. Do not shave for 48 hours prior to surgery.  If you are to be admitted after surgery, leave suitcase in car until your room has been assigned. Patients discharged on the day of surgery will not be allowed to drive home.

## 2012-07-10 NOTE — Pre-Procedure Instructions (Signed)
Dr. Malen Gauze wants repeat CBC done on day of surgery to recheck platelets.

## 2012-07-10 NOTE — Pre-Procedure Instructions (Signed)
Pt c/o "lump in throat" and pointed to area around thyroid gland. She denied any thyroid problems and recently saw her primary MD who told her it was prob. virus or allergy related. Dr. Sherron Ales notified.

## 2012-07-10 NOTE — Pre-Procedure Instructions (Signed)
I called lab and spoke with Asher Muir re platelets result of "clumped platelets". She said lab was unable to estimate number of platelets and advised that pt may need to have CBC redrawn on DOS.

## 2012-07-12 MED ORDER — GENTAMICIN SULFATE 40 MG/ML IJ SOLN
INTRAMUSCULAR | Status: AC
  Administered 2012-07-13: 100 mL via INTRAVENOUS
  Filled 2012-07-12: qty 8.75

## 2012-07-13 ENCOUNTER — Encounter (HOSPITAL_COMMUNITY): Payer: Self-pay | Admitting: Anesthesiology

## 2012-07-13 ENCOUNTER — Ambulatory Visit (HOSPITAL_COMMUNITY)
Admission: RE | Admit: 2012-07-13 | Discharge: 2012-07-14 | Disposition: A | Discharge status: Home / Self Care | Payer: BC Managed Care – PPO | Source: Ambulatory Visit | Attending: Obstetrics and Gynecology | Admitting: Obstetrics and Gynecology

## 2012-07-13 ENCOUNTER — Ambulatory Visit (HOSPITAL_COMMUNITY): Payer: BC Managed Care – PPO | Admitting: Anesthesiology

## 2012-07-13 ENCOUNTER — Encounter (HOSPITAL_COMMUNITY)
Admission: RE | Disposition: A | Discharge status: Home / Self Care | Payer: Self-pay | Source: Ambulatory Visit | Attending: Obstetrics and Gynecology

## 2012-07-13 DIAGNOSIS — N949 Unspecified condition associated with female genital organs and menstrual cycle: Secondary | ICD-10-CM | POA: Insufficient documentation

## 2012-07-13 DIAGNOSIS — D259 Leiomyoma of uterus, unspecified: Secondary | ICD-10-CM

## 2012-07-13 DIAGNOSIS — D252 Subserosal leiomyoma of uterus: Secondary | ICD-10-CM | POA: Insufficient documentation

## 2012-07-13 DIAGNOSIS — Z853 Personal history of malignant neoplasm of breast: Secondary | ICD-10-CM | POA: Insufficient documentation

## 2012-07-13 DIAGNOSIS — N938 Other specified abnormal uterine and vaginal bleeding: Secondary | ICD-10-CM | POA: Insufficient documentation

## 2012-07-13 HISTORY — PX: LAPAROSCOPIC ASSISTED VAGINAL HYSTERECTOMY: SHX5398

## 2012-07-13 LAB — CBC
HCT: 40.6 % (ref 36.0–46.0)
Hemoglobin: 13.2 g/dL (ref 12.0–15.0)
RDW: 13.6 % (ref 11.5–15.5)
WBC: 5.6 10*3/uL (ref 4.0–10.5)
WBC: 12.6 10*3/uL — ABNORMAL HIGH (ref 4.0–10.5)

## 2012-07-13 SURGERY — HYSTERECTOMY, VAGINAL, LAPAROSCOPY-ASSISTED
Anesthesia: General | Site: Abdomen | Wound class: Clean Contaminated

## 2012-07-13 MED ORDER — ACETAMINOPHEN 10 MG/ML IV SOLN
INTRAVENOUS | Status: AC
  Filled 2012-07-13: qty 100

## 2012-07-13 MED ORDER — SODIUM CHLORIDE 0.9 % IJ SOLN
INTRAMUSCULAR | Status: DC | PRN
  Administered 2012-07-13: 10 mL

## 2012-07-13 MED ORDER — MIDAZOLAM HCL 5 MG/5ML IJ SOLN
INTRAMUSCULAR | Status: DC | PRN
  Administered 2012-07-13: 2 mg via INTRAVENOUS

## 2012-07-13 MED ORDER — LACTATED RINGERS IV SOLN
INTRAVENOUS | Status: DC
  Administered 2012-07-13 (×2): via INTRAVENOUS
  Administered 2012-07-13: 100 mL/h via INTRAVENOUS

## 2012-07-13 MED ORDER — ONDANSETRON HCL 4 MG/2ML IJ SOLN: 4.0000 mg | Freq: Four times a day (QID) | INTRAMUSCULAR | Status: DC | PRN

## 2012-07-13 MED ORDER — DIPHENHYDRAMINE HCL 50 MG/ML IJ SOLN: 12.5000 mg | Freq: Four times a day (QID) | INTRAMUSCULAR | Status: DC | PRN

## 2012-07-13 MED ORDER — FENTANYL CITRATE 0.05 MG/ML IJ SOLN
INTRAMUSCULAR | Status: AC
  Filled 2012-07-13: qty 5

## 2012-07-13 MED ORDER — NEOSTIGMINE METHYLSULFATE 1 MG/ML IJ SOLN
INTRAMUSCULAR | Status: AC
  Filled 2012-07-13: qty 1

## 2012-07-13 MED ORDER — KETOROLAC TROMETHAMINE 30 MG/ML IJ SOLN: 30.0000 mg | Freq: Once | INTRAMUSCULAR | Status: DC

## 2012-07-13 MED ORDER — LISINOPRIL 10 MG PO TABS
10.0000 mg | ORAL_TABLET | Freq: Every day | ORAL | Status: DC
Start: 2012-07-14 — End: 2012-07-14
  Filled 2012-07-13 (×2): qty 1

## 2012-07-13 MED ORDER — LIDOCAINE HCL (CARDIAC) 20 MG/ML IV SOLN
INTRAVENOUS | Status: DC | PRN
  Administered 2012-07-13: 80 mg via INTRAVENOUS

## 2012-07-13 MED ORDER — 0.9 % SODIUM CHLORIDE (POUR BTL) OPTIME
TOPICAL | Status: DC | PRN
  Administered 2012-07-13: 1000 mL

## 2012-07-13 MED ORDER — PROMETHAZINE HCL 25 MG/ML IJ SOLN: 6.2500 mg | INTRAMUSCULAR | Status: DC | PRN

## 2012-07-13 MED ORDER — MENTHOL 3 MG MT LOZG: 1.0000 | LOZENGE | OROMUCOSAL | Status: DC | PRN

## 2012-07-13 MED ORDER — MEPERIDINE HCL 25 MG/ML IJ SOLN: 6.2500 mg | INTRAMUSCULAR | Status: DC | PRN

## 2012-07-13 MED ORDER — SCOPOLAMINE 1 MG/3DAYS TD PT72
MEDICATED_PATCH | TRANSDERMAL | Status: AC
  Administered 2012-07-13: 1.5 mg via TRANSDERMAL
  Filled 2012-07-13: qty 1

## 2012-07-13 MED ORDER — BUPIVACAINE HCL (PF) 0.25 % IJ SOLN
INTRAMUSCULAR | Status: DC | PRN
  Administered 2012-07-13: 8 mL

## 2012-07-13 MED ORDER — CITRIC ACID-SODIUM CITRATE 334-500 MG/5ML PO SOLN: 30.0000 mL | Freq: Once | ORAL | Status: DC

## 2012-07-13 MED ORDER — SCOPOLAMINE 1 MG/3DAYS TD PT72: 1.0000 | MEDICATED_PATCH | Freq: Once | TRANSDERMAL | Status: DC

## 2012-07-13 MED ORDER — DEXAMETHASONE SODIUM PHOSPHATE 10 MG/ML IJ SOLN
INTRAMUSCULAR | Status: AC
  Filled 2012-07-13: qty 1

## 2012-07-13 MED ORDER — NALOXONE HCL 0.4 MG/ML IJ SOLN: 0.4000 mg | INTRAMUSCULAR | Status: DC | PRN

## 2012-07-13 MED ORDER — TAMOXIFEN CITRATE 10 MG PO TABS
20.0000 mg | ORAL_TABLET | Freq: Every day | ORAL | Status: DC
  Administered 2012-07-13: 20 mg via ORAL
  Filled 2012-07-13 (×3): qty 2

## 2012-07-13 MED ORDER — ZOLPIDEM TARTRATE 5 MG PO TABS: 5.0000 mg | ORAL_TABLET | Freq: Every evening | ORAL | Status: DC | PRN

## 2012-07-13 MED ORDER — PANTOPRAZOLE SODIUM 40 MG PO TBEC: 40.0000 mg | DELAYED_RELEASE_TABLET | Freq: Once | ORAL | Status: DC

## 2012-07-13 MED ORDER — ACETAMINOPHEN 10 MG/ML IV SOLN
INTRAVENOUS | Status: DC | PRN
  Administered 2012-07-13: 1000 mg via INTRAVENOUS

## 2012-07-13 MED ORDER — SODIUM CHLORIDE 0.9 % IJ SOLN: 9.0000 mL | INTRAMUSCULAR | Status: DC | PRN

## 2012-07-13 MED ORDER — SUCCINYLCHOLINE CHLORIDE 20 MG/ML IJ SOLN
INTRAMUSCULAR | Status: AC
  Filled 2012-07-13: qty 10

## 2012-07-13 MED ORDER — KETOROLAC TROMETHAMINE 30 MG/ML IJ SOLN: 30.0000 mg | Freq: Four times a day (QID) | INTRAMUSCULAR | Status: DC

## 2012-07-13 MED ORDER — KETOROLAC TROMETHAMINE 30 MG/ML IJ SOLN
30.0000 mg | Freq: Four times a day (QID) | INTRAMUSCULAR | Status: DC
  Administered 2012-07-13 (×2): 30 mg via INTRAVENOUS
  Filled 2012-07-13 (×2): qty 1

## 2012-07-13 MED ORDER — DEXAMETHASONE SODIUM PHOSPHATE 10 MG/ML IJ SOLN
INTRAMUSCULAR | Status: DC | PRN
  Administered 2012-07-13: 10 mg via INTRAVENOUS

## 2012-07-13 MED ORDER — ONDANSETRON HCL 4 MG/2ML IJ SOLN
INTRAMUSCULAR | Status: AC
  Filled 2012-07-13: qty 2

## 2012-07-13 MED ORDER — FAMOTIDINE 20 MG PO TABS: 20.0000 mg | ORAL_TABLET | Freq: Once | ORAL | Status: DC

## 2012-07-13 MED ORDER — IBUPROFEN 800 MG PO TABS
800.0000 mg | ORAL_TABLET | Freq: Three times a day (TID) | ORAL | Status: DC | PRN
  Administered 2012-07-14: 800 mg via ORAL
  Filled 2012-07-13: qty 1

## 2012-07-13 MED ORDER — PROPOFOL 10 MG/ML IV EMUL
INTRAVENOUS | Status: AC
  Filled 2012-07-13: qty 20

## 2012-07-13 MED ORDER — MORPHINE SULFATE (PF) 1 MG/ML IV SOLN
INTRAVENOUS | Status: DC
  Administered 2012-07-13: 8 mg via INTRAVENOUS
  Administered 2012-07-13: 13 mg via INTRAVENOUS
  Administered 2012-07-13: 4.4 mg via INTRAVENOUS
  Administered 2012-07-13: 11:00:00 via INTRAVENOUS
  Administered 2012-07-14: 2 mg via INTRAVENOUS
  Administered 2012-07-14: 5 mg via INTRAVENOUS
  Filled 2012-07-13: qty 25

## 2012-07-13 MED ORDER — HYDROMORPHONE HCL PF 1 MG/ML IJ SOLN
INTRAMUSCULAR | Status: DC | PRN
  Administered 2012-07-13: 1 mg via INTRAVENOUS

## 2012-07-13 MED ORDER — LACTATED RINGERS IV SOLN
INTRAVENOUS | Status: DC
  Administered 2012-07-13 (×2): via INTRAVENOUS

## 2012-07-13 MED ORDER — BUPIVACAINE HCL (PF) 0.25 % IJ SOLN
INTRAMUSCULAR | Status: AC
  Filled 2012-07-13: qty 30

## 2012-07-13 MED ORDER — LIDOCAINE HCL (CARDIAC) 20 MG/ML IV SOLN
INTRAVENOUS | Status: AC
  Filled 2012-07-13: qty 5

## 2012-07-13 MED ORDER — ROCURONIUM BROMIDE 100 MG/10ML IV SOLN
INTRAVENOUS | Status: DC | PRN
  Administered 2012-07-13: 5 mg via INTRAVENOUS
  Administered 2012-07-13: 35 mg via INTRAVENOUS
  Administered 2012-07-13: 10 mg via INTRAVENOUS

## 2012-07-13 MED ORDER — NEOSTIGMINE METHYLSULFATE 1 MG/ML IJ SOLN
INTRAMUSCULAR | Status: DC | PRN
  Administered 2012-07-13: 1 mg via INTRAVENOUS

## 2012-07-13 MED ORDER — ALPRAZOLAM 0.25 MG PO TABS: 0.2500 mg | ORAL_TABLET | Freq: Every day | ORAL | Status: DC | PRN

## 2012-07-13 MED ORDER — KETOROLAC TROMETHAMINE 30 MG/ML IJ SOLN: 15.0000 mg | Freq: Once | INTRAMUSCULAR | Status: DC | PRN

## 2012-07-13 MED ORDER — ACETAMINOPHEN 10 MG/ML IV SOLN: 1000.0000 mg | Freq: Once | INTRAVENOUS | Status: DC

## 2012-07-13 MED ORDER — FENTANYL CITRATE 0.05 MG/ML IJ SOLN
INTRAMUSCULAR | Status: AC
  Administered 2012-07-13: 50 ug via INTRAVENOUS
  Filled 2012-07-13: qty 2

## 2012-07-13 MED ORDER — ROCURONIUM BROMIDE 50 MG/5ML IV SOLN
INTRAVENOUS | Status: AC
  Filled 2012-07-13: qty 1

## 2012-07-13 MED ORDER — FENTANYL CITRATE 0.05 MG/ML IJ SOLN
INTRAMUSCULAR | Status: DC | PRN
  Administered 2012-07-13: 50 ug via INTRAVENOUS
  Administered 2012-07-13: 100 ug via INTRAVENOUS
  Administered 2012-07-13 (×2): 50 ug via INTRAVENOUS

## 2012-07-13 MED ORDER — HYDROMORPHONE HCL PF 1 MG/ML IJ SOLN
INTRAMUSCULAR | Status: AC
  Filled 2012-07-13: qty 1

## 2012-07-13 MED ORDER — GLYCOPYRROLATE 0.2 MG/ML IJ SOLN
INTRAMUSCULAR | Status: AC
  Filled 2012-07-13: qty 4

## 2012-07-13 MED ORDER — BUTORPHANOL TARTRATE 1 MG/ML IJ SOLN: 1.0000 mg | INTRAMUSCULAR | Status: DC | PRN

## 2012-07-13 MED ORDER — PANTOPRAZOLE SODIUM 40 MG PO TBEC
DELAYED_RELEASE_TABLET | ORAL | Status: AC
  Administered 2012-07-13: 40 mg via ORAL
  Filled 2012-07-13: qty 1

## 2012-07-13 MED ORDER — GLYCOPYRROLATE 0.2 MG/ML IJ SOLN
INTRAMUSCULAR | Status: DC | PRN
  Administered 2012-07-13: 0.2 mg via INTRAVENOUS

## 2012-07-13 MED ORDER — OXYCODONE-ACETAMINOPHEN 5-325 MG PO TABS
1.0000 | ORAL_TABLET | ORAL | Status: DC | PRN
  Administered 2012-07-14: 1 via ORAL
  Filled 2012-07-13: qty 2

## 2012-07-13 MED ORDER — PANTOPRAZOLE SODIUM 40 MG PO TBEC: 40.0000 mg | DELAYED_RELEASE_TABLET | Freq: Once | ORAL | Status: AC

## 2012-07-13 MED ORDER — DIPHENHYDRAMINE HCL 12.5 MG/5ML PO ELIX
12.5000 mg | ORAL_SOLUTION | Freq: Four times a day (QID) | ORAL | Status: DC | PRN
  Administered 2012-07-13: 12.5 mg via ORAL
  Filled 2012-07-13: qty 5

## 2012-07-13 MED ORDER — PANTOPRAZOLE SODIUM 40 MG PO TBEC
40.0000 mg | DELAYED_RELEASE_TABLET | Freq: Every day | ORAL | Status: DC
  Administered 2012-07-13: 40 mg via ORAL
  Filled 2012-07-13 (×3): qty 1

## 2012-07-13 MED ORDER — ONDANSETRON HCL 4 MG/2ML IJ SOLN
INTRAMUSCULAR | Status: DC | PRN
  Administered 2012-07-13: 4 mg via INTRAVENOUS

## 2012-07-13 MED ORDER — FENTANYL CITRATE 0.05 MG/ML IJ SOLN: 25.0000 ug | INTRAMUSCULAR | Status: DC | PRN

## 2012-07-13 MED ORDER — MIDAZOLAM HCL 2 MG/2ML IJ SOLN
INTRAMUSCULAR | Status: AC
  Filled 2012-07-13: qty 2

## 2012-07-13 SURGICAL SUPPLY — 39 items
ADH SKN CLS APL DERMABOND .7 (GAUZE/BANDAGES/DRESSINGS) ×2
ADH SKN CLS LQ APL DERMABOND (GAUZE/BANDAGES/DRESSINGS) ×1
CABLE HIGH FREQUENCY MONO STRZ (ELECTRODE) IMPLANT
CATH ROBINSON RED A/P 16FR (CATHETERS) IMPLANT
CLOTH BEACON ORANGE TIMEOUT ST (SAFETY) ×2 IMPLANT
CONT PATH 16OZ SNAP LID 3702 (MISCELLANEOUS) ×2 IMPLANT
COVER TABLE BACK 60X90 (DRAPES) ×2 IMPLANT
DECANTER SPIKE VIAL GLASS SM (MISCELLANEOUS) IMPLANT
DERMABOND ADHESIVE PROPEN (GAUZE/BANDAGES/DRESSINGS) ×1
DERMABOND ADVANCED (GAUZE/BANDAGES/DRESSINGS) ×2
DERMABOND ADVANCED .7 DNX12 (GAUZE/BANDAGES/DRESSINGS) ×2 IMPLANT
DERMABOND ADVANCED .7 DNX6 (GAUZE/BANDAGES/DRESSINGS) IMPLANT
ELECT LIGASURE LONG (ELECTRODE) ×2 IMPLANT
ELECT REM PT RETURN 9FT ADLT (ELECTROSURGICAL) ×2
ELECTRODE REM PT RTRN 9FT ADLT (ELECTROSURGICAL) ×1 IMPLANT
GLOVE BIO SURGEON STRL SZ7 (GLOVE) ×4 IMPLANT
GLOVE BIOGEL PI IND STRL 6.5 (GLOVE) ×1 IMPLANT
GLOVE BIOGEL PI INDICATOR 6.5 (GLOVE) ×3
GLOVE INDICATOR 7.0 STRL GRN (GLOVE) ×3 IMPLANT
GLOVE NEODERM STER SZ 7 (GLOVE) ×2 IMPLANT
GOWN STRL REIN XL XLG (GOWN DISPOSABLE) ×8 IMPLANT
NEEDLE INSUFFLATION 120MM (ENDOMECHANICALS) ×2 IMPLANT
NS IRRIG 1000ML POUR BTL (IV SOLUTION) ×2 IMPLANT
PACK LAVH (CUSTOM PROCEDURE TRAY) ×2 IMPLANT
PROTECTOR NERVE ULNAR (MISCELLANEOUS) ×2 IMPLANT
SEALER TISSUE G2 CVD JAW 45CM (ENDOMECHANICALS) ×2 IMPLANT
SET IRRIG TUBING LAPAROSCOPIC (IRRIGATION / IRRIGATOR) IMPLANT
SUT MON AB 2-0 CT1 36 (SUTURE) ×4 IMPLANT
SUT VIC AB 0 CT1 18XCR BRD8 (SUTURE) ×3 IMPLANT
SUT VIC AB 0 CT1 36 (SUTURE) ×2 IMPLANT
SUT VIC AB 0 CT1 8-18 (SUTURE) ×6
SUT VICRYL 0 TIES 12 18 (SUTURE) ×2 IMPLANT
SUT VICRYL 4-0 PS2 18IN ABS (SUTURE) ×2 IMPLANT
TOWEL OR 17X24 6PK STRL BLUE (TOWEL DISPOSABLE) ×4 IMPLANT
TRAY FOLEY CATH 14FR (SET/KITS/TRAYS/PACK) ×2 IMPLANT
TROCAR OPTI TIP 5M 100M (ENDOMECHANICALS) ×4 IMPLANT
TROCAR XCEL DIL TIP R 11M (ENDOMECHANICALS) ×2 IMPLANT
WARMER LAPAROSCOPE (MISCELLANEOUS) ×2 IMPLANT
WATER STERILE IRR 1000ML POUR (IV SOLUTION) ×2 IMPLANT

## 2012-07-13 NOTE — Op Note (Signed)
Preoperative diagnosis: Abnormal uterine bleeding, leiomyoma  Postoperative diagnosis: Same  Procedure: LAVH  Surgeon: Marcelle Overlie  Assistant: Renaldo Fiddler  EBL: 200 cc  Specimens removed: Uterus and cervix, to pathology  Drains: Foley catheter  Procedure and findings:  The patient taken the operating room after an adequate level of general anesthesia was obtained with the legs in stirrups the abdomen perineum and vagina were prepped and draped in usual fashion for LAVH. Bladder drained at that point, EUA carried out uterus upper limit normal size mobile adnexa negative. Hulka tenaculum was positioned.  Prior to this appropriate timeout for taken before starting the procedure. The subumbilical area was infiltrated with quarter percent Marcaine plain small incision was made in the varies needle was introduced without difficulty. Its intra-abdominal position was verified by pressure water testing. After a 3 L pneumoperitoneum syncopated lap scopic trocar and sleeve were then inserted there was no evidence of any bleeding or trauma. 3 finger breaths above the symphysis in the midline a 5 mm trocar was inserted under direct visualization and the patient placed in Trendelenburg. Pelvic findings as follows:  The uterus itself was 6 week size, symmetrically enlarged at the fundus bilateral adnexa unremarkable the upper abdomen was otherwise negative.   Starting on the right the in seal device was then used to coagulate and divide the utero-ovarian pedicle down to and including the round ligament on each side with excellent hemostasis. Both ovaries were thus conserved. Once this was completed the vaginal portion the procedure started   Legs were extended, weighted speculum was positioned cervix grasped with tenaculum, the cervical vaginal mucosa was incised, posterior culdotomy performed without difficulty. The bladder was advanced superiorly with sharp and blunt dissection until the anterior peritoneal  reflection could be identified, the peritoneum was entered sharply and a retractor then used to gently elevate the bladder out of the field. In sequential manner, staying close to the uterus the LigaSure device was used to coagulate and divide the uterosacral ligament, cardinal ligament uterine vasculature pedicles and upper broad ligament pedicles. Once this was completed the fundus of the uterus was delivered posteriorly this required morselization. Remaining pedicles were clamped divided and free tie with 0 Vicryl suture. The vaginal cuff was then closed from 3 to 9:00 with a running locked 2-0 Vicryl suture. Prior to closure sponge denies precast reported as correct x2 vaginal mucosa was then closed right to left with interrupted 2-0 Monocryl sutures. Foley catheter positioned draining clear urine at that point.  Repeat laparoscopy carried out at that point revealing excellent hemostasis at the operative sites. This was observed through escape of the pneumoperitoneum showing hemostasis. Its was removed gas allowed to escape because closed with 4-0 Vicryl subcuticular and Dermabond she tolerated this well went to recovery room in good condition.  Dictated with [facility]  Ilija Maxim M. Milana Obey.D.

## 2012-07-13 NOTE — Anesthesia Postprocedure Evaluation (Signed)
  Anesthesia Post-op Note  Anesthesia Post Note  Patient: Cassandra Mccann  Procedure(s) Performed: Procedure(s) (LRB): LAPAROSCOPIC ASSISTED VAGINAL HYSTERECTOMY (N/A)  Anesthesia type: General  Patient location: PACU  Post pain: Pain level controlled  Post assessment: Post-op Vital signs reviewed  Last Vitals:  Filed Vitals:   07/13/12 1015  BP: 105/50  Pulse: 63  Temp:   Resp: 16    Post vital signs: Reviewed  Level of consciousness: sedated  Complications: No apparent anesthesia complications

## 2012-07-13 NOTE — Anesthesia Preprocedure Evaluation (Signed)
Anesthesia Evaluation  Patient identified by MRN, date of birth, ID band Patient awake    Reviewed: Allergy & Precautions, H&P , NPO status , Patient's Chart, lab work & pertinent test results  Airway Mallampati: II TM Distance: >3 FB Neck ROM: full    Dental no notable dental hx. (+) Teeth Intact   Pulmonary neg pulmonary ROS,    Pulmonary exam normal       Cardiovascular hypertension, Pt. on medications     Neuro/Psych negative neurological ROS     GI/Hepatic Neg liver ROS, GERD-  Medicated and Controlled,  Endo/Other  negative endocrine ROS  Renal/GU negative Renal ROS  negative genitourinary   Musculoskeletal negative musculoskeletal ROS (+)   Abdominal Normal abdominal exam  (+)   Peds  Hematology negative hematology ROS (+)   Anesthesia Other Findings   Reproductive/Obstetrics negative OB ROS                           Anesthesia Physical Anesthesia Plan  ASA: II  Anesthesia Plan: General   Post-op Pain Management:    Induction: Intravenous  Airway Management Planned: Oral ETT  Additional Equipment:   Intra-op Plan:   Post-operative Plan: Extubation in OR  Informed Consent: I have reviewed the patients History and Physical, chart, labs and discussed the procedure including the risks, benefits and alternatives for the proposed anesthesia with the patient or authorized representative who has indicated his/her understanding and acceptance.   Dental Advisory Given  Plan Discussed with: CRNA and Surgeon  Anesthesia Plan Comments:         Anesthesia Quick Evaluation

## 2012-07-13 NOTE — Transfer of Care (Signed)
Immediate Anesthesia Transfer of Care Note  Patient: Cassandra Mccann  Procedure(s) Performed: Procedure(s): LAPAROSCOPIC ASSISTED VAGINAL HYSTERECTOMY (N/A)  Patient Location: PACU  Anesthesia Type:General  Level of Consciousness: awake, alert  and oriented  Airway & Oxygen Therapy: Patient Spontanous Breathing and Patient connected to nasal cannula oxygen  Post-op Assessment: Report given to PACU RN and Post -op Vital signs reviewed and stable  Post vital signs: Reviewed and stable  Complications: No apparent anesthesia complications

## 2012-07-13 NOTE — Progress Notes (Signed)
The patient was re-examined with no change in status 

## 2012-07-13 NOTE — Anesthesia Postprocedure Evaluation (Signed)
  Anesthesia Post-op Note  Patient: Cassandra Mccann  Procedure(s) Performed: Procedure(s): LAPAROSCOPIC ASSISTED VAGINAL HYSTERECTOMY (N/A)  Patient Location: [facility]  Anesthesia Type:General  Level of Consciousness: awake, alert  and oriented  Airway and Oxygen Therapy: Patient Spontanous Breathing  Post-op Pain: none  Post-op Assessment: Post-op Vital signs reviewed and Patient's Cardiovascular Status Stable  Post-op Vital Signs: Reviewed and stable  Complications: No apparent anesthesia complications

## 2012-07-13 NOTE — Anesthesia Procedure Notes (Signed)
Procedure Name: Intubation Date/Time: 07/13/2012 7:32 AM Performed by: Graciela Husbands Pre-anesthesia Checklist: Patient being monitored, Suction available, Emergency Drugs available, Patient identified and Timeout performed Patient Re-evaluated:Patient Re-evaluated prior to inductionOxygen Delivery Method: Circle system utilized Preoxygenation: Pre-oxygenation with 100% oxygen Intubation Type: IV induction Ventilation: Mask ventilation without difficulty Laryngoscope Size: Mac and 3 Grade View: Grade I Tube type: Oral Tube size: 7.0 mm Number of attempts: 1 Airway Equipment and Method: Stylet Secured at: 20 cm Tube secured with: Tape Dental Injury: Teeth and Oropharynx as per pre-operative assessment

## 2012-07-14 ENCOUNTER — Encounter (HOSPITAL_COMMUNITY): Payer: Self-pay | Admitting: Obstetrics and Gynecology

## 2012-07-14 DIAGNOSIS — D259 Leiomyoma of uterus, unspecified: Secondary | ICD-10-CM

## 2012-07-14 DIAGNOSIS — D252 Subserosal leiomyoma of uterus: Secondary | ICD-10-CM | POA: Diagnosis not present

## 2012-07-14 LAB — CBC
HCT: 30.5 % — ABNORMAL LOW (ref 36.0–46.0)
Hemoglobin: 10.1 g/dL — ABNORMAL LOW (ref 12.0–15.0)
MCH: 29.6 pg (ref 26.0–34.0)
MCHC: 33.1 g/dL (ref 30.0–36.0)

## 2012-07-14 MED ORDER — OXYCODONE-ACETAMINOPHEN 5-325 MG PO TABS: 1.0000 | ORAL_TABLET | ORAL | Status: DC | PRN

## 2012-07-14 MED ORDER — IBUPROFEN 800 MG PO TABS: 800.0000 mg | ORAL_TABLET | Freq: Three times a day (TID) | ORAL | Status: DC | PRN

## 2012-07-14 NOTE — Progress Notes (Signed)
1 Day Post-Op Procedure(s) (LRB): LAPAROSCOPIC ASSISTED VAGINAL HYSTERECTOMY (N/A)  Subjective: Patient reports tolerating PO.    Objective: I have reviewed patient's vital signs and labs. BP 99/61  Pulse 57  Temp(Src) 97.9 F (36.6 C) (Oral)  Resp 16  Ht 5\' 7"  (1.702 m)  Wt 83.915 kg (185 lb)  BMI 28.97 kg/m2  SpO2 99% CBC    Component Value Date/Time   WBC 10.2 07/14/2012 0535   WBC 6.7 02/21/2012 1358   RBC 3.41* 07/14/2012 0535   RBC 4.28 02/21/2012 1358   HGB 10.1* 07/14/2012 0535   HGB 12.9 02/21/2012 1358   HCT 30.5* 07/14/2012 0535   HCT 39.0 02/21/2012 1358   PLT PLATELET CLUMPING, SUGGEST RECOLLECTION OF SAMPLE IN CITRATE TUBE. 07/14/2012 0535   PLT 171 02/21/2012 1358   MCV 89.4 07/14/2012 0535   MCV 91.1 02/21/2012 1358   MCH 29.6 07/14/2012 0535   MCH 30.0 02/21/2012 1358   MCHC 33.1 07/14/2012 0535   MCHC 33.0 02/21/2012 1358   RDW 13.9 07/14/2012 0535   RDW 13.7 02/21/2012 1358   LYMPHSABS 1.6 02/21/2012 1358   LYMPHSABS 1.5 02/21/2011 1600   MONOABS 0.7 02/21/2012 1358   MONOABS 0.6 02/21/2011 1600   EOSABS 0.1 02/21/2012 1358   EOSABS 0.1 02/21/2011 1600   BASOSABS 0.2* 02/21/2012 1358   BASOSABS 0.1 02/21/2011 1600     abd soft + BS, Incs C/D, no bruising  Assessment: s/p Procedure(s): LAPAROSCOPIC ASSISTED VAGINAL HYSTERECTOMY (N/A): stable  Plan: Discharge home  LOS: 1 day    Santosh Petter M 07/14/2012, 8:28 AM

## 2012-07-14 NOTE — Discharge Summary (Signed)
Physician Discharge Summary  Patient ID: Cassandra Mccann MRN: [id number] DOB/AGE: [birth year] 44 y.o.  Admit date: 07/13/2012 Discharge date: 07/14/2012  Admission Diagnoses:  Discharge Diagnoses:  Active Problems:   Leiomyoma of uterus, unspecified   Discharged Condition: good  Hospital Course: LAVH>> D/C on POD #1, afeb, tol PO  Consults: None  Significant Diagnostic Studies:  Results for orders placed during the hospital encounter of 07/13/12 (from the past 24 hour(s))  CBC     Status: Abnormal   Collection Time    07/13/12 12:50 PM      Result Value Range   WBC 12.6 (*) 4.0 - 10.5 K/uL   RBC 4.00  3.87 - 5.11 MIL/uL   Hemoglobin 11.6 (*) 12.0 - 15.0 g/dL   HCT 40.9 (*) 81.1 - 91.4 %   MCV 88.3  78.0 - 100.0 fL   MCH 29.0  26.0 - 34.0 pg   MCHC 32.9  30.0 - 36.0 g/dL   RDW 78.2  95.6 - 21.3 %   Platelets    150 - 400 K/uL   Value: PLATELET CLUMPING SUGGEST RECOLLECTION OF SAMPLE IN CITRATE TUBE  CBC     Status: Abnormal   Collection Time    07/14/12  5:35 AM      Result Value Range   WBC 10.2  4.0 - 10.5 K/uL   RBC 3.41 (*) 3.87 - 5.11 MIL/uL   Hemoglobin 10.1 (*) 12.0 - 15.0 g/dL   HCT 08.6 (*) 57.8 - 46.9 %   MCV 89.4  78.0 - 100.0 fL   MCH 29.6  26.0 - 34.0 pg   MCHC 33.1  30.0 - 36.0 g/dL   RDW 62.9  52.8 - 41.3 %   Platelets    150 - 400 K/uL   Value: PLATELET CLUMPING, SUGGEST RECOLLECTION OF SAMPLE IN CITRATE TUBE.    Treatments: surgery: LAVH  Discharge Exam: Blood pressure 99/61, pulse 57, temperature 97.9 F (36.6 C), temperature source Oral, resp. rate 16, height 5\' 7"  (1.702 m), weight 83.915 kg (185 lb), SpO2 99.00%. Abd soft + BS, Incs C/D, no bruising Disposition: home, office 7-10 days  NOTE>>>due to plt clumping and inability to do true plt machine count, lab suggested redraw in citrate tube>>will do prior to D/C   Future Appointments Provider Department Dept Phone   09/14/2012 3:40 PM [facility]  IMAGING [phone]   Patient should wear two piece clothing and wear no powder or deodorant. Patient should arrive 15 minutes early.   02/19/2013 3:30 PM [facility] MEDICAL ONCOLOGY [phone]   02/19/2013 4:00 PM Pierce Crane, MD [facility] MEDICAL ONCOLOGY [phone]       Medication List    STOP taking these medications       chlorpheniramine 4 MG tablet  Commonly known as:  CHLOR-TRIMETON     lisinopril 10 MG tablet  Commonly known as:  PRINIVIL,ZESTRIL      TAKE these medications       ALPRAZolam 0.25 MG tablet  Commonly known as:  XANAX  Take 0.25 mg by mouth daily as needed for anxiety.     ibuprofen 800 MG tablet  Commonly known as:  ADVIL,MOTRIN  Take 1 tablet (800 mg total) by mouth every 8 (eight) hours as needed (mild pain).     omeprazole 20 MG capsule  Commonly known as:  PRILOSEC  Take 20 mg by mouth daily.  oxyCODONE-acetaminophen 5-325 MG per tablet  Commonly known as:  PERCOCET/ROXICET  Take 1-2 tablets by mouth every 4 (four) hours as needed.     tamoxifen 20 MG tablet  Commonly known as:  NOLVADEX  Take 1 tablet (20 mg total) by mouth daily.           Follow-up Information   Follow up with Meriel Pica, MD.   Contact information:   [address] [place] [address] [phone]       Signed: Meriel Pica 07/14/2012, 8:34 AM

## 2012-09-25 ENCOUNTER — Ambulatory Visit
Admission: RE | Admit: 2012-09-25 | Discharge: 2012-09-25 | Disposition: A | Discharge status: Home / Self Care | Payer: BC Managed Care – PPO | Source: Ambulatory Visit | Attending: Oncology | Admitting: Oncology

## 2012-11-24 ENCOUNTER — Telehealth: Payer: Self-pay | Admitting: *Deleted

## 2012-11-24 NOTE — Telephone Encounter (Signed)
Spoke to Cassandra Mccann concerning appt for new med onc.  Cassandra Mccann request Dr. Darnelle Catalan.  Scheduled Cassandra Mccann with Norina Buzzard, NP on 12/04/12 at 1000.  Confirmed new appt date and time.  Calendar and letter mailed.

## 2012-12-04 ENCOUNTER — Encounter: Payer: Self-pay | Admitting: Family

## 2012-12-04 ENCOUNTER — Ambulatory Visit (HOSPITAL_BASED_OUTPATIENT_CLINIC_OR_DEPARTMENT_OTHER): Payer: BC Managed Care – PPO | Admitting: Family

## 2012-12-04 ENCOUNTER — Telehealth: Payer: Self-pay | Admitting: *Deleted

## 2012-12-04 VITALS — BP 131/83 | HR 73 | Temp 98.3°F | Resp 19 | Ht 67.0 in | Wt 174.6 lb

## 2012-12-04 DIAGNOSIS — D0590 Unspecified type of carcinoma in situ of unspecified breast: Secondary | ICD-10-CM

## 2012-12-04 DIAGNOSIS — D0511 Intraductal carcinoma in situ of right breast: Secondary | ICD-10-CM

## 2012-12-04 NOTE — Telephone Encounter (Signed)
appts made and printed...td 

## 2012-12-04 NOTE — Patient Instructions (Addendum)
Please contact us at [phone] if you have any questions or concerns.  Please continue to do well and enjoy life!!!  Get plenty of rest, drink plenty of water, exercise daily (walking), eat a balanced diet.  Take  Vitamin D3 1000 IUs daily.   Mm Digital Diagnostic Bilat  09/25/2012   *RADIOLOGY REPORT*  Clinical Data:  42 year old female for annual bilateral mammograms - history of right breast cancer and lumpectomy in 2011.  DIGITAL DIAGNOSTIC BILATERAL MAMMOGRAM WITH IMPLANTS AND CAD  Comparison: 09/13/2011 and prior mammograms dating back to 06/16/2006  Findings:  ACR Breast Density Category 2: There is a scattered fibroglandular pattern.  Bilateral subpectoral saline implants are noted. Scarring within the right breast is again identified. Biopsy clip within the upper outer left breast is present.  There is no evidence of suspicious mass, nonsurgical distortion or worrisome calcifications bilaterally.  Mammographic images were processed with CAD.  IMPRESSION: No mammographic evidence of breast malignancy.  Right breast scarring.  BI-RADS CATEGORY 2:  Benign finding(s).  RECOMMENDATION: Bilateral diagnostic mammograms in 1 year.  I have discussed the findings and recommendations with the patient. Results were also provided in writing at the conclusion of the visit.  If applicable, a reminder letter will be sent to the patient regarding her next appointment.   Original Report Authenticated By: Harmon Pier, M.D.

## 2012-12-04 NOTE — Progress Notes (Addendum)
[facility]  [phone] [phone]  OFFICE PROGRESS NOTE   ID: Cassandra Mccann   DOB: Dec 25, 1970  MR#: [id number]  EXB#:[id number]   PCP: Cassandra Beat, MD GYN: Duke Salvia. Cassandra Mccann M.D.  Cassandra Mccann. Cassandra Mccann, M.D. RAD ONC: Cassandra Mccann, M.D.   HISTORY OF PRESENT ILLNESS: From Dr. Theron Arista Mccann's new patient evaluation note dated 09/06/2009: "This is a delightful 42 year old from [place] here with her husband, Duane, for evaluation of her recent diagnosis of breast cancer.  This woman has been in good health.  She has undergone baseline mammography at age 18 because of positive family history for breast cancer.  She had a followup mammogram done Sep 01, 2009, which showed suspicious right breast calcifications in the right breast.  A biopsy was recommended.  The patient underwent a biopsy on 05/06, which showed DCIS immediate grade, ER positive, PR positive.  The patient is due to undergo an MRI scan.  Of note is that on the original mammogram, it was reviewed and noted to be some calcifications in the left breast as well and these were also scheduled to be biopsied later on."  Her subsequent history is as detailed below.   INTERVAL HISTORY: Dr. Darnelle Catalan and I saw Cassandra Mccann today for followup of right breast DCIS  The patient was last seen by Dr. Donnie Coffin on 02/21/2012.  Her interval history is significant for having an laparoscopic hysterectomy (ovaries retained) in 06/2012 after it was discovered she had endometrial hyperplasia and fibroids.  Her postoperative course was unremarkable.  She is establishing herself with Dr. Darrall Dears service today.   REVIEW OF SYSTEMS: A 10 point review of systems was completed and is negative except a brown discharge prior to hysterectomy in 06/2012, a newly developed umbilical hernia, and chronic sinus headaches.  The patient  mentioned having a rare occurrence of mild hot flashes and night sweats.  The patient mentioned  having occasional right breast/axillary area discomfort.  Let her known we will get an ultrasound of the area. [name] denies any other symptomatology and a detailed review of systems was otherwise noncontributory.   PAST MEDICAL HISTORY: Past Medical History  Diagnosis Date  . 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 Hyperlipidemia   . Hypertension   . GERD (gastroesophageal reflux disease)   . Endometrial hyperplasia 06/2012  . Umbilical hernia 06/2012    PAST SURGICAL HISTORY: Past Surgical History  Procedure Laterality Date  . Breast surgery  2005    implants  . Breast lumpectomy  2011  . Laparoscopic assisted vaginal hysterectomy N/A 07/13/2012    Procedure: LAPAROSCOPIC ASSISTED VAGINAL HYSTERECTOMY;  Surgeon: Meriel Pica, MD;  Location: [facility];  Service: Gynecology;  Laterality: N/A;    FAMILY HISTORY Family History  Problem Relation Age of Onset  . Cancer Mother     Breast  . Stroke Mother   . Hypertension Mother   . Cancer Father     bladder  Father is deceased from complications of bladder cancer.  Mother had a history of breast cancer in late 44s and died at age 24.  She has a half-brother who is in good health.  GYNECOLOGIC HISTORY: G2 P2, menarche age 18, age of parity 42, laparoscopic hysterectomy in 06/2012 with ovaries retained, birth control pill use from ages 42 through 46.  No history of hormone replacement therapy.  Her husband has had a vasectomy.  SOCIAL HISTORY:  Married to her husband, Cassandra Mccann since 2006.  She has 2 teenage children.  The patient is a Armed forces operational officer.  Her husband previously worked Chemical engineer cars for [employer] and works at [employer].  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  They attend [employer].  In her spare time she enjoys shopping and going to the beach.  ADVANCED DIRECTIVES: In place - joint medical power of attorney between her husband Cassandra Mccann and her friend  Salvatore Decent who may be reached at [phone].  HEALTH MAINTENANCE: History  Substance Use Topics  . Smoking status: Never Smoker   . Smokeless tobacco: Never Used  . Alcohol Use: Yes     Comment: occasionally    Colonoscopy: N/A PAP: Not on file Bone density: N/A Lipid panel: 03/18/2012  Allergies  Allergen Reactions  . Codeine Other (See Comments)    Unknown. Can take Percocet.  Marland Kitchen Penicillins Other (See Comments)    Unknown childhood reaction  . Sulfonamide Derivatives Nausea And Vomiting    Current Outpatient Prescriptions  Medication Sig Dispense Refill  . ALPRAZolam (XANAX) 0.25 MG tablet Take 0.25 mg by mouth daily as needed for anxiety.      Marland Kitchen ibuprofen (ADVIL,MOTRIN) 800 MG tablet Take 1 tablet (800 mg total) by mouth every 8 (eight) hours as needed (mild pain).  30 tablet  2  . lisinopril (PRINIVIL,ZESTRIL) 10 MG tablet Take 5 mg by mouth daily.      Marland Kitchen omeprazole (PRILOSEC) 20 MG capsule Take 20 mg by mouth daily.      . tamoxifen (NOLVADEX) 20 MG tablet Take 1 tablet (20 mg total) by mouth daily.  30 tablet  11   No current facility-administered medications for this visit.    OBJECTIVE: Filed Vitals:   12/04/12 0954  BP: 131/83  Pulse: 73  Temp: 98.3 F (36.8 C)  Resp: 19     Body mass index is 27.34 kg/(m^2).      ECOG FS: 1 - Symptomatic but completely ambulatory  General appearance: Alert, cooperative, well nourished, no apparent distress Head: Normocephalic, without obvious abnormality, atraumatic Eyes: Conjunctivae/corneas clear, PERRLA, EOMI Nose: Nares, septum and mucosa are normal, no drainage or sinus tenderness Neck: No adenopathy, supple, symmetrical, trachea midline, thyroid not enlarged, no tenderness Resp: Clear to auscultation bilaterally Cardio: Regular rate and rhythm, S1, S2 normal, no murmur, click, rub or gallop Breasts: Breast implants bilaterally, right breast and axillary area have well-healed surgical scars, no lymphadenopathy,  no nipple inversion, no axilla fullness GI: Soft, distended, non-tender, hypoactive bowel sounds, no organomegaly, small umbilical hernia, abdominal area striae Extremities: Extremities normal, atraumatic, no cyanosis or edema Lymph nodes: Cervical, supraclavicular, and axillary nodes normal Neurologic: Grossly normal   LAB RESULTS: Lab Results  Component Value Date   WBC 10.2 07/14/2012   NEUTROABS 4.2 02/21/2012   HGB 10.1* 07/14/2012   HCT 30.5* 07/14/2012   MCV 89.4 07/14/2012   PLT 217 07/14/2012      Chemistry      Component Value Date/Time   NA 140 07/10/2012 0920   NA 140 02/21/2012 1358   K 4.7 07/10/2012 0920   K 4.2 02/21/2012 1358   CL 106 07/10/2012 0920   CL 105 02/21/2012 1358   CO2 24 07/10/2012 0920   CO2 25 02/21/2012 1358   BUN 14 07/10/2012 0920   BUN 13.0 02/21/2012 1358   CREATININE 0.83 07/10/2012 0920   CREATININE 0.9 02/21/2012 1358  Component Value Date/Time   CALCIUM 9.1 07/10/2012 0920   CALCIUM 9.8 02/21/2012 1358   ALKPHOS 76 02/21/2012 1358   ALKPHOS 61 06/14/2011 0912   AST 21 02/21/2012 1358   AST 26 06/14/2011 0912   ALT 17 02/21/2012 1358   ALT 21 06/14/2011 0912   BILITOT 0.30 02/21/2012 1358   BILITOT 0.3 06/14/2011 0912       Lab Results  Component Value Date   LABCA2 13 02/21/2012    Urinalysis No results found for this basename: colorurine,  appearanceur,  labspec,  phurine,  glucoseu,  hgbur,  bilirubinur,  ketonesur,  proteinur,  urobilinogen,  nitrite,  leukocytesur    STUDIES: Mm Digital Diagnostic Bilat 09/25/2012   *RADIOLOGY REPORT*  Clinical Data:  42 year old female for annual bilateral mammograms - history of right breast cancer and lumpectomy in 2011.  DIGITAL DIAGNOSTIC BILATERAL MAMMOGRAM WITH IMPLANTS AND CAD  Comparison: 09/13/2011 and prior mammograms dating back to 06/16/2006  Findings:  ACR Breast Density Category 2: There is a scattered fibroglandular pattern.  Bilateral subpectoral saline implants are noted.  Scarring within the right breast is again identified. Biopsy clip within the upper outer left breast is present.  There is no evidence of suspicious mass, nonsurgical distortion or worrisome calcifications bilaterally.  Mammographic images were processed with CAD.  IMPRESSION: No mammographic evidence of breast malignancy.  Right breast scarring.  BI-RADS CATEGORY 2:  Benign finding(s).  RECOMMENDATION: Bilateral diagnostic mammograms in 1 year.  I have discussed the findings and recommendations with the patient. Results were also provided in writing at the conclusion of the visit.  If applicable, a reminder letter will be sent to the patient regarding her next appointment.   Original Report Authenticated By: Harmon Pier, M.D.    ASSESSMENT: Cassandra Mccann is a  42 y.o. BRCA negative, [place], [place] woman: 1.  Status post right breast needle core biopsy on 09/01/2009 which showed ductal carcinoma in situ with calcifications and necrosis, estrogen receptor 99% positive, progesterone receptor 98% positive.  2.  The patient had bilateral breast MRI on 09/08/2009 which showed background parenchymal enhancement is moderate.  There were multiple foci of enhancement bilaterally.  Within the lateral portion of the right breast, there was an area of nonmass persistent enhancement in the region of recent stereotactic guided core biopsy.  This region measures 1.4 x 1.7 x 0.9 cm.  Within this enhancement was clip artifact from recent stereotactic guided core biopsy.  Images of the left breast were unremarkable.  Within the upper-outer quadrant of the left breast, there was a cluster of calcifications seen mammographically.  There is no discrete enhancement on MRI to correlate with this area of calcifications.  No enlarged or suspicious internal mammary or axillary lymph nodes were identified (clinical stage 0, pTis).  3.  The patient had genetic counseling and testing BRACAnalysis report dated 09/20/2009  showed no mutation detected in BRCA1 or BRCA2 breast cancer genes.  4.  Status post right breast lumpectomy on 10/09/2009 for a stage 0, pTis pNX, 0.4 cm ductal carcinoma in situ intermediate grade, S2 and receptor 99% positive, progesterone receptor 98% positive.  5.  The patient underwent radiation therapy from 11/14/2009 through 01/03/2010.  6.  The patient started antiestrogen therapy with Tamoxifen in 12/2009.   7.  Status post laparoscopic assisted vaginal hysterectomy on 07/13/2012 (ovaries retained) after discovery of endometrial hyperplasia and fibroid tumors.  8.  Right breast/axillary area discomfort.  PLAN: Mrs. Tatlock will continue on antiestrogen  therapy with Tamoxifen 20 mg by mouth daily.  The plan for her to continue antiestrogen therapy is for 5 years at this point (until 12/2014), but the patient expressed an interest in continuing antiestrogen therapy with Tamoxifen or an aromatase inhibitor for 10 years total.  Mrs. Mcdevitt stated she did not need a refill of Tamoxifen at this time.  For the patient's right breast/axillary area discomfort, a right breast ultrasound has been ordered.  Before she receives a right breast ultrasound I would like for her would like to try anti-inflammatories and rest/ice the area for a week to see if her discomfort resolves before proceeding with the ultrasound.  The patient agreed to this plan of care.  Mrs. Cichowski will have her primary care physician Dr. Dallas Schimke forward any lab results to our office.  The patient was asked to take vitamin D3 1000 IUs by mouth daily in addition to daily exercise (walking).  We plan to see Mrs. Whitmire again in one year.  We will order her annual bilateral digital diagnostic mammogram due in 09/2013 for her.  All questions were answered.  The patient was encouraged to contact us in the interim with any problems, questions or concerns.   Larina Bras, NP-C 12/04/2012, 2:03 PM  ADDENDUM: This 42 year old BRCA  negative [place] woman establish herself in my practice today. We reviewed her diagnosis, treatment history and prognosis. In brief:  She underwent right lumpectomy with no sentinel lymph node sampling June of 2011 for a 4 mm area of ductal carcinoma in situ, grade 2, estrogen receptor 99% positive, progesterone receptor 98% positive. This pTis pNX, stage 0 breast cancer was treated adjuvantly with radiation completed in September of 2011. She started tamoxifen in September of 2011.  The patient underwent simple hysterectomy without salpingo-oophorectomy March of 2014  Karsynn has a good understanding of the fact that her breast cancer was not life-threatening, since it was not invasive. It is very sensible to take tamoxifen for 5 years, as a preventive both in terms of this tumor recurring, and more importantly to prevent a new breast cancer developing in either breast.  The patient is interested in continuing tamoxifen for 10 years. She understands in this setting we do not have data beyond 5 years. I therefore do not know that taking another 5 years of tamoxifen would be beneficial to her. I also do not have any data that tested would be harmful. Accordingly with the patient being fully informed I am comfortable with the plan for her to receive tamoxifen for total of 10 years.  She is due for her next mammogram in may of 2015. We will see her on a yearly basis until she completes her 10 years of followup. She knows to call for any problems that may develop before her next visit here.  I personally saw this patient and performed a substantive portion of this encounter with the listed APP documented above.   Lowella Dell, MD

## 2012-12-14 ENCOUNTER — Other Ambulatory Visit: Payer: Self-pay | Admitting: *Deleted

## 2012-12-14 DIAGNOSIS — D0511 Intraductal carcinoma in situ of right breast: Secondary | ICD-10-CM

## 2012-12-14 MED ORDER — LISINOPRIL 10 MG PO TABS: 5.0000 mg | ORAL_TABLET | Freq: Every day | ORAL | Status: DC

## 2013-01-08 ENCOUNTER — Ambulatory Visit
Admission: RE | Admit: 2013-01-08 | Discharge: 2013-01-08 | Disposition: A | Discharge status: Home / Self Care | Payer: BC Managed Care – PPO | Source: Ambulatory Visit | Attending: Family | Admitting: Family

## 2013-01-08 ENCOUNTER — Other Ambulatory Visit: Payer: Self-pay | Admitting: Family

## 2013-01-08 ENCOUNTER — Telehealth: Payer: Self-pay | Admitting: Family

## 2013-01-08 DIAGNOSIS — D0511 Intraductal carcinoma in situ of right breast: Secondary | ICD-10-CM

## 2013-01-08 NOTE — Telephone Encounter (Signed)
Spoke to [employer] about right mammogram and [place] completed today.  Results showed no suspicious microcalcifications, masses or architectural distortion, no abnormality, benign findings in right breast to explain tenderness.  Patient voiced understanding

## 2013-01-29 ENCOUNTER — Ambulatory Visit (INDEPENDENT_AMBULATORY_CARE_PROVIDER_SITE_OTHER): Payer: BC Managed Care – PPO | Admitting: Surgery

## 2013-02-19 ENCOUNTER — Other Ambulatory Visit: Payer: BC Managed Care – PPO | Admitting: Lab

## 2013-02-19 ENCOUNTER — Ambulatory Visit: Payer: BC Managed Care – PPO | Admitting: Oncology

## 2013-03-17 ENCOUNTER — Ambulatory Visit (INDEPENDENT_AMBULATORY_CARE_PROVIDER_SITE_OTHER): Payer: BC Managed Care – PPO | Admitting: Surgery

## 2013-03-17 ENCOUNTER — Encounter (INDEPENDENT_AMBULATORY_CARE_PROVIDER_SITE_OTHER): Payer: Self-pay | Admitting: Surgery

## 2013-03-17 VITALS — BP 130/92 | HR 68 | Temp 97.4°F | Resp 15 | Ht 67.0 in | Wt 180.0 lb

## 2013-03-17 DIAGNOSIS — D0591 Unspecified type of carcinoma in situ of right breast: Secondary | ICD-10-CM

## 2013-03-17 DIAGNOSIS — D0590 Unspecified type of carcinoma in situ of unspecified breast: Secondary | ICD-10-CM

## 2013-03-17 NOTE — Progress Notes (Signed)
[facility]  Ovidio Kin, MD,  FACS [address] [place]  [address] [place], [place]    [address] Phone:  [phone] FAX:  [phone]   Re:   Cassandra Mccann DOB:   10-02-1970 MRN:   [id number]  ASSESSMENT AND PLAN: 1.  DCIS right breast.  10 o'clock. (Tis, N0)  Lumpectomy - 10/09/2009.  ER - 99%, PR - 98%  Sees Dr. Darnelle Catalan Donnie Coffin) and Dr. Lyda Jester.  On tamoxifen.  Doing well without symptoms.  Disease free.    I will see her in one year and I will alternate q 6 months with Dr. Darnelle Catalan.  So her follow up with me is 1 year.   2.  Bilateral implants, in [place] - 2005. 3.  Hypertension. On Lisinopril. 4.  Small umbilical hernia.  I discussed the indications and complications of hernia surgery with the patient.  I discussed both the laparoscopic and open approach to hernia repair..  The potential risks of hernia surgery include, but are not limited to, bleeding, infection, open surgery, nerve injury, and recurrence of the hernia.  I provided the patient literature about hernia surgery.  The hernia is small.  I really think that it is her choice whether she wants to have this fixed.  It is causing pain and interfering with her work outs, so she wants to fix this.  HISTORY OF PRESENT ILLNESS: Chief Complaint  Patient presents with  . Breast Cancer Long Term Follow Up    LTFU yearly br recheck/ also hsa umb hernia    Cassandra Mccann is a 42 y.o. (DOB: 31-Aug-1970)  white female who is a patient of [name], MD and comes to me today for follow up of right breast cancer.  She sees Dr. Martina Sinner from GYN standpoint.  She is doing well.  Her new complaint is that she has an umbilical hernia she noticed several months ago, while doing "planks" while working out.  It has bothered her 3 or 4 times over the last several months.  It has not changed in size.  She had a Lap assisted vag hysterectomy by Dr. Martina Sinner 07/13/2012 for benign disease.  It sounds like she has had  the hernia at least since then.  She has had no other abdominal surgery or hernia surgery.  Social History: She works as a Armed forces operational officer. We talked about 3D dental images and the incidental findings.  PHYSICAL EXAM: BP 130/92  Pulse 68  Temp(Src) 97.4 F (36.3 C) (Temporal)  Resp 15  Ht 5\' 7"  (1.702 m)  Wt 180 lb 0.3 oz (81.657 kg)  BMI 28.19 kg/m2  HEENT:  Pupils equal.  Dentition good.   NECK:  Supple.  No thyroid mass. LYMPH NODES:  No cervical, supraclavicular, or axillary adenopathy. BREASTS -  RIGHT:  Has bilateral implants. No palpable mass or nodule.  No nipple discharge.   LEFT:  Breast implants.  No palpable mass or nodule.  No nipple discharge. UPPER EXTREMITIES:  No evidence of lymphedema. ABDOMEN:  Small umbilical hernia, approx 1.5 cm.  I can't tell how big the fascial defect is.  DATA REVIEWED: Mammogram ([facility]) - 01/08/2013 - negative.  Ovidio Kin, MD, FACS Office:  [phone]

## 2013-03-19 ENCOUNTER — Ambulatory Visit (INDEPENDENT_AMBULATORY_CARE_PROVIDER_SITE_OTHER): Payer: BC Managed Care – PPO | Admitting: Surgery

## 2013-03-26 ENCOUNTER — Other Ambulatory Visit: Payer: Self-pay | Admitting: *Deleted

## 2013-03-26 DIAGNOSIS — D0591 Unspecified type of carcinoma in situ of right breast: Secondary | ICD-10-CM

## 2013-03-26 MED ORDER — TAMOXIFEN CITRATE 20 MG PO TABS: 20.0000 mg | ORAL_TABLET | Freq: Every day | ORAL | Status: DC

## 2013-04-01 DIAGNOSIS — K429 Umbilical hernia without obstruction or gangrene: Secondary | ICD-10-CM

## 2013-04-21 ENCOUNTER — Ambulatory Visit (INDEPENDENT_AMBULATORY_CARE_PROVIDER_SITE_OTHER): Payer: BC Managed Care – PPO | Admitting: Surgery

## 2013-04-21 ENCOUNTER — Encounter (INDEPENDENT_AMBULATORY_CARE_PROVIDER_SITE_OTHER): Payer: Self-pay | Admitting: Surgery

## 2013-04-21 VITALS — BP 118/70 | HR 64 | Temp 98.6°F | Resp 14 | Ht 67.0 in | Wt 182.4 lb

## 2013-04-21 DIAGNOSIS — Z8719 Personal history of other diseases of the digestive system: Secondary | ICD-10-CM

## 2013-04-21 DIAGNOSIS — Z9889 Other specified postprocedural states: Secondary | ICD-10-CM

## 2013-04-21 NOTE — Progress Notes (Addendum)
[facility]  Ovidio Kin, MD,  FACS [address] [place]  [address] [place], [place]    [address] Phone:  [phone] FAX:  [phone]   Re:   Cassandra Mccann DOB:   Jul 07, 1970 MRN:   [id number]  ASSESSMENT AND PLAN: 1.  DCIS right breast.  10 o'clock. (Tis, N0)  Lumpectomy - 10/09/2009.  ER - 99%, PR - 98%  Sees Dr. Darnelle Catalan Donnie Coffin) and Dr. Lyda Jester.  On tamoxifen.  Doing well without symptoms.  Disease free.    I will see her in one year and I will alternate q 6 months with Dr. Darnelle Catalan.  So her follow up with me is 1 year.   2.  Bilateral implants, in [place] - 2005. 3.  Hypertension. On Lisinopril. 4.  Repaired umbilical hernia.- 04/01/2013 - at [facility] - D. Alvin Rubano  She has done well from this.  To limit physical activity x 1 month.  To see me in 10-11 months for annual follow up of breast cancer.  [She brought doughnuts by our office Christmas Eve.]   HISTORY OF PRESENT ILLNESS: Chief Complaint  Patient presents with  . Routine Post Op    1st p/o hernia sx 04/01/13    Cassandra Mccann is a 42 y.o. (DOB: [birth year])  white female who is a patient of Spencer Copland, MD and comes to me today for follow up of umbilical hernia repair.  She sees Dr. Martina Sinner from GYN standpoint.  Comes with her daughter. She is doing well post op.  Talked about post op recovery.  To take it easy for 4 weeks, then no limit.  Past Medical History: Lap assisted vag hysterectomy by Dr. Martina Sinner 07/13/2012 for benign disease.  Social History: She works as a Armed forces operational officer. We talked about 3D dental images and the incidental findings.  PHYSICAL EXAM: BP 118/70  Pulse 64  Temp(Src) 98.6 F (37 C) (Temporal)  Resp 14  Ht 5\' 7"  (1.702 m)  Wt 182 lb 6.4 oz (82.736 kg)  BMI 28.56 kg/m2  HEENT:  Pupils equal.  Dentition good.   ABDOMEN:  Wound looks good.  She is concerned about one area of skin, but this ought to "flatten out".  DATA REVIEWED: Mammogram ([facility]) - 01/08/2013 - negative.  Ovidio Kin, MD, FACS Office:  [phone]

## 2013-05-03 ENCOUNTER — Other Ambulatory Visit: Payer: Self-pay | Admitting: *Deleted

## 2013-05-03 DIAGNOSIS — D0511 Intraductal carcinoma in situ of right breast: Secondary | ICD-10-CM

## 2013-05-03 MED ORDER — LISINOPRIL 10 MG PO TABS: 5.0000 mg | ORAL_TABLET | Freq: Every day | ORAL | Status: DC

## 2013-06-25 ENCOUNTER — Other Ambulatory Visit: Payer: Self-pay | Admitting: *Deleted

## 2013-06-25 NOTE — Telephone Encounter (Signed)
Last office visit 07/02/2012.  Last CPX 03/18/2012.  Ok to refill?

## 2013-06-27 MED ORDER — LISINOPRIL 10 MG PO TABS: 5.0000 mg | ORAL_TABLET | Freq: Every day | ORAL | Status: DC

## 2013-06-27 NOTE — Telephone Encounter (Signed)
Ok to refill, 2 refills  F/u cpx in next few months

## 2013-09-27 ENCOUNTER — Other Ambulatory Visit: Payer: Self-pay | Admitting: *Deleted

## 2013-09-27 NOTE — Telephone Encounter (Signed)
Last office visit 07/02/2012.  Last refilled 06/27/2013 for #15 with 2 refills with note that she needs office visit.  No future appointments scheduled at this time.  Refill?

## 2013-09-27 NOTE — Telephone Encounter (Signed)
Schedule cpx prior to refills,  If cpx sched, #15, 2 ref

## 2013-09-28 NOTE — Telephone Encounter (Signed)
Cassandra Mccann in front office called and left message for patient to call back and schedule CPE.

## 2013-09-29 MED ORDER — LISINOPRIL 10 MG PO TABS: 5.0000 mg | ORAL_TABLET | Freq: Every day | ORAL | Status: DC

## 2013-10-01 ENCOUNTER — Ambulatory Visit
Admission: RE | Admit: 2013-10-01 | Discharge: 2013-10-01 | Disposition: A | Discharge status: Home / Self Care | Payer: BC Managed Care – PPO | Source: Ambulatory Visit | Attending: Family | Admitting: Family

## 2013-10-01 DIAGNOSIS — D0511 Intraductal carcinoma in situ of right breast: Secondary | ICD-10-CM

## 2013-11-10 ENCOUNTER — Telehealth: Payer: Self-pay | Admitting: Oncology

## 2013-11-10 NOTE — Telephone Encounter (Signed)
per pof to r/s appt GM on pal-cld & left pt a message to adv of new appt time & date-will mail copy of sch to pt

## 2013-11-11 ENCOUNTER — Telehealth: Payer: Self-pay | Admitting: Oncology

## 2013-11-11 ENCOUNTER — Other Ambulatory Visit: Payer: Self-pay

## 2013-11-11 NOTE — Telephone Encounter (Signed)
cld pt & left a message of r/s appt time & date-will mail copy of [place]

## 2013-11-12 ENCOUNTER — Encounter: Payer: Self-pay | Admitting: *Deleted

## 2013-11-12 ENCOUNTER — Encounter: Payer: Self-pay | Admitting: Family Medicine

## 2013-11-12 ENCOUNTER — Ambulatory Visit (INDEPENDENT_AMBULATORY_CARE_PROVIDER_SITE_OTHER): Payer: BC Managed Care – PPO | Admitting: Family Medicine

## 2013-11-12 VITALS — BP 110/70 | HR 66 | Temp 98.2°F | Ht 66.5 in | Wt 185.5 lb

## 2013-11-12 DIAGNOSIS — F411 Generalized anxiety disorder: Secondary | ICD-10-CM

## 2013-11-12 DIAGNOSIS — I10 Essential (primary) hypertension: Secondary | ICD-10-CM

## 2013-11-12 DIAGNOSIS — E7800 Pure hypercholesterolemia, unspecified: Secondary | ICD-10-CM

## 2013-11-12 DIAGNOSIS — F419 Anxiety disorder, unspecified: Secondary | ICD-10-CM

## 2013-11-12 MED ORDER — LISINOPRIL 10 MG PO TABS: 5.0000 mg | ORAL_TABLET | Freq: Every day | ORAL | Status: DC

## 2013-11-12 MED ORDER — ALPRAZOLAM 0.25 MG PO TABS: 0.2500 mg | ORAL_TABLET | Freq: Every day | ORAL | Status: DC | PRN

## 2013-11-12 NOTE — Patient Instructions (Addendum)
Start exercise routine. Look into venlafaxine for mood and possible perimenopausal symptoms. Work on [employer], consider [employer]. Look into TDAP history.

## 2013-11-12 NOTE — Assessment & Plan Note (Signed)
Well controlled. Continue current medication.  

## 2013-11-12 NOTE — Assessment & Plan Note (Addendum)
Normal labs eval. Likel perimenopausal.  Pt is not interested  In SSRI, SNRI at this time. i worsening symtpoms will call back.  Use alprazolam prn. Start exercise, relaxation and stress relief.

## 2013-11-12 NOTE — Progress Notes (Signed)
Pre visit review using our clinic review tool, if applicable. No additional management support is needed unless otherwise documented below in the visit note. 

## 2013-11-12 NOTE — Progress Notes (Signed)
Subjective:    Patient ID: Cassandra Mccann, female    DOB: 04/19/1971, 43 y.o.   MRN: [id number]  HPI 43 year old female pt of Dr. Lillie Fragmin who presents for annual eval of chronic issues.  In past: DCIS, disease free  GYN (Dr. Matthew Saras) - does pelvic and breast She has been irritable in last year. Ever since hysterectomy. She is stressed out more.  Seeing Dr. Ailene Ravel ( natural MD) for ? hormonal issues.  Cortisol saliva abn.  Now on supplements.     TSH nml.  Vit D  Wt Readings from Last 3 Encounters:  11/12/13 185 lb 8 oz (84.142 kg)  04/21/13 182 lb 6.4 oz (82.736 kg)  03/17/13 180 lb 0.3 oz (81.657 kg)  Anxiety, poor control: using alprazolam prn about once every few weeks. She feels like she needs it more frequently.  prescribed GYN A1C: 5.3  Elevated Cholesterol: Labs done at other MD. Tot 172, trig 53, HDL 84, LDL 108 Lab Results  Component Value Date   CHOL 213* 03/18/2012   HDL 86.40 03/18/2012   LDLCALC 88 09/28/2010   LDLDIRECT 102.7 03/18/2012   TRIG 69.0 03/18/2012   CHOLHDL 2 03/18/2012  Diet compliance: Moderate, does go throiugh spells of poor eating Exercise: None Other complaints:  Hypertension:  Well controlled on lisinopril BP Readings from Last 3 Encounters:  11/12/13 110/70  04/21/13 118/70  03/17/13 130/92  Using medication without problems or lightheadedness: None Chest pain with exertion:None Edema:None Short of breath:None Average home BPs: 120/70 Other issues:     Review of Systems  Constitutional: Negative for fever and fatigue.  HENT: Negative for ear pain.   Eyes: Negative for pain.  Respiratory: Negative for chest tightness and shortness of breath.   Cardiovascular: Negative for chest pain, palpitations and leg swelling.  Gastrointestinal: Negative for abdominal pain.  Genitourinary: Negative for dysuria.       Objective:   Physical Exam  Constitutional: Vital signs are normal. She appears well-developed and well-nourished. She  is cooperative.  Non-toxic appearance. She does not appear ill. No distress.  HENT:  Head: Normocephalic.  Right Ear: Hearing, tympanic membrane, external ear and ear canal normal.  Left Ear: Hearing, tympanic membrane, external ear and ear canal normal.  Nose: Nose normal.  Eyes: Conjunctivae, EOM and lids are normal. Pupils are equal, round, and reactive to light. Lids are everted and swept, no foreign bodies found.  Neck: Trachea normal and normal range of motion. Neck supple. Carotid bruit is not present. No mass and no thyromegaly present.  Cardiovascular: Normal rate, regular rhythm, S1 normal, S2 normal, normal heart sounds and intact distal pulses.  Exam reveals no gallop.   No murmur heard. Pulmonary/Chest: Effort normal and breath sounds normal. No respiratory distress. She has no wheezes. She has no rhonchi. She has no rales.  Abdominal: Soft. Normal appearance and bowel sounds are normal. She exhibits no distension, no fluid wave, no abdominal bruit and no mass. There is no hepatosplenomegaly. There is no tenderness. There is no rebound, no guarding and no CVA tenderness. No hernia.  Lymphadenopathy:    She has no cervical adenopathy.    She has no axillary adenopathy.  Neurological: She is alert. She has normal strength. No cranial nerve deficit or sensory deficit.  Skin: Skin is warm, dry and intact. No rash noted.  Psychiatric: Her speech is normal and behavior is normal. Judgment normal. Her mood appears not anxious. Cognition and memory are normal. She does  not exhibit a depressed mood.          Assessment & Plan:  The patient's preventative maintenance and recommended screening tests for an annual wellness exam were reviewed in full today. Brought up to date unless services declined.  Counselled on the importance of diet, exercise, and its role in overall health and mortality. The patient's FH and SH was reviewed, including their home life, tobacco status, and drug and  alcohol status.   GYN: partial hysterectomy for endometrial hyperplasia  Mammo: on tamoxifen for DCIS now in remission Vaccine: Uptodate with Hep B and Hep A, ?TDap No early colon cancer.

## 2013-11-12 NOTE — Assessment & Plan Note (Signed)
Well controlled. On no medication.

## 2013-11-13 ENCOUNTER — Telehealth: Payer: Self-pay | Admitting: Family Medicine

## 2013-11-13 NOTE — Telephone Encounter (Signed)
Relevant patient education mailed to patient.  

## 2013-12-07 ENCOUNTER — Ambulatory Visit: Payer: BC Managed Care – PPO | Admitting: Oncology

## 2013-12-07 ENCOUNTER — Other Ambulatory Visit: Payer: BC Managed Care – PPO

## 2014-01-06 ENCOUNTER — Other Ambulatory Visit: Payer: Self-pay | Admitting: *Deleted

## 2014-01-06 DIAGNOSIS — D0591 Unspecified type of carcinoma in situ of right breast: Secondary | ICD-10-CM

## 2014-01-06 MED ORDER — TAMOXIFEN CITRATE 20 MG PO TABS: 20.0000 mg | ORAL_TABLET | Freq: Every day | ORAL | Status: DC

## 2014-01-26 ENCOUNTER — Telehealth: Payer: Self-pay | Admitting: Oncology

## 2014-01-26 ENCOUNTER — Ambulatory Visit (HOSPITAL_BASED_OUTPATIENT_CLINIC_OR_DEPARTMENT_OTHER): Payer: BC Managed Care – PPO | Admitting: Oncology

## 2014-01-26 VITALS — BP 115/68 | HR 58 | Temp 98.3°F | Resp 18 | Ht 66.5 in | Wt 177.7 lb

## 2014-01-26 DIAGNOSIS — I10 Essential (primary) hypertension: Secondary | ICD-10-CM

## 2014-01-26 DIAGNOSIS — D259 Leiomyoma of uterus, unspecified: Secondary | ICD-10-CM

## 2014-01-26 DIAGNOSIS — D0591 Unspecified type of carcinoma in situ of right breast: Secondary | ICD-10-CM

## 2014-01-26 DIAGNOSIS — E7800 Pure hypercholesterolemia, unspecified: Secondary | ICD-10-CM

## 2014-01-26 DIAGNOSIS — Z853 Personal history of malignant neoplasm of breast: Secondary | ICD-10-CM

## 2014-01-26 DIAGNOSIS — D0590 Unspecified type of carcinoma in situ of unspecified breast: Secondary | ICD-10-CM

## 2014-01-26 MED ORDER — TAMOXIFEN CITRATE 20 MG PO TABS: 20.0000 mg | ORAL_TABLET | Freq: Every day | ORAL | Status: DC

## 2014-01-26 NOTE — Telephone Encounter (Signed)
per pof to sch pt appt-gave pt copy of sch °

## 2014-01-26 NOTE — Progress Notes (Signed)
[facility]  [phone] [phone]) [phone]  OFFICE PROGRESS NOTE   ID: Cassandra Mccann   DOB: 03-Oct-1970  MR#: [id number]  HLK#:[id number]   PCP: Owens Loffler, MD GYN: Ralene Bathe. Matthew Saras M.D.  Kasandra KnudsenFenton Malling. Lucia Gaskins, M.D. RAD ONC: Gery Pray, M.D.   HISTORY OF PRESENT ILLNESS: From Dr. Collier Salina Rubin's new patient evaluation note dated 09/06/2009:  "This is a delightful 43 year old from [place] here with her husband, Duane, for evaluation of her recent diagnosis of breast cancer.  This woman has been in good health.  She has undergone baseline mammography at age 33 because of positive family history for breast cancer.  She had a followup mammogram done Sep 01, 2009, which showed suspicious right breast calcifications in the right breast.  A biopsy was recommended.  The patient underwent a biopsy on 05/06, which showed DCIS immediate grade, ER positive, PR positive.  The patient is due to undergo an MRI scan.  Of note is that on the original mammogram, it was reviewed and noted to be some calcifications in the left breast as well and these were also scheduled to be biopsied later on."  Her subsequent history is as detailed below.   INTERVAL HISTORY: Cassandra Mccann returns today for followup of her noninvasive breast cancer. The interval history is significant for having undergone hysterectomy in March of 2014. This showed ([id number]) a benign uterus and cervix. She continues on tamoxifen with mild hot flashes as the only significant side effect. In particular she denies vaginal dryness or vaginal wetness. She also had a basal cell removed 11/11/2013 ([id number] [id number]).  REVIEW OF SYSTEMS: Of course she has not had a period since her hysterectomy and she wonders if she might be postmenopausal. She has gained about 10 pounds a year for last 3 years. She is moody and irritable. She has not had a bone density. She has not had hair thinning or facial hair developing. She denies insomnia.  She is not exercising quite as regularly as I should". She recently started a protein diet and she was advised not to exercise "too much" until after ".her body has one adopted A detailed review of systems was otherwise stable and   PAST MEDICAL HISTORY: Past Medical History  Diagnosis Date  . 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 Hyperlipidemia   . Hypertension   . GERD (gastroesophageal reflux disease)   . Endometrial hyperplasia 06/2012  . Umbilical hernia 05/353  . Cancer     PAST SURGICAL HISTORY: Past Surgical History  Procedure Laterality Date  . Breast surgery  2005    implants  . Breast lumpectomy  2011  . Laparoscopic assisted vaginal hysterectomy N/A 07/13/2012    Procedure: LAPAROSCOPIC ASSISTED VAGINAL HYSTERECTOMY;  Surgeon: Margarette Asal, MD;  Location: [facility];  Service: Gynecology;  Laterality: N/A;  . Abdominal hysterectomy  [id number]  . Hernia repair      FAMILY HISTORY Family History  Problem Relation Age of Onset  . Cancer Mother     Breast  . Stroke Mother   . Hypertension Mother   . Cancer Father     bladder  Father is deceased from complications of bladder cancer.  Mother had a history of breast cancer in late 73s and died at age 43.  She has a half-brother who is in good health.  GYNECOLOGIC HISTORY: G2 P2, menarche age 23, age of parity 40, laparoscopic hysterectomy in 06/2012 with ovaries  retained, birth control pill use from ages 26 through 64.  No history of hormone replacement therapy.  Her husband has had a vasectomy.  SOCIAL HISTORY: Married to [place] since 2006.  She has 2 teenage children.  The patient is a Copywriter, advertising.  Her husband previously worked [employer] for [employer] and works at [employer].  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  They attend [employer].  In her spare time she enjoys shopping and going to the beach.  ADVANCED DIRECTIVES: In place - joint  medical power of attorney between her husband Jazzlynn Rawe and her friend Gloris Ham who may be reached at [phone].  HEALTH MAINTENANCE: History  Substance Use Topics  . Smoking status: Never Smoker   . Smokeless tobacco: Never Used  . Alcohol Use: Yes     Comment: occasionally    Colonoscopy: N/A PAP: Not on file Bone density: N/A Lipid panel: 03/18/2012  Allergies  Allergen Reactions  . Codeine Other (See Comments)    Unknown. Can take Percocet.  Marland Kitchen Penicillins Other (See Comments)    Unknown childhood reaction  . Sulfonamide Derivatives Nausea And Vomiting    Current Outpatient Prescriptions  Medication Sig Dispense Refill  . ALPRAZolam (XANAX) 0.25 MG tablet Take 1 tablet (0.25 mg total) by mouth daily as needed for anxiety.  30 tablet  0  . ibuprofen (ADVIL,MOTRIN) 800 MG tablet Take 1 tablet (800 mg total) by mouth every 8 (eight) hours as needed (mild pain).  30 tablet  2  . lisinopril (PRINIVIL,ZESTRIL) 10 MG tablet Take 0.5 tablets (5 mg total) by mouth daily.  30 tablet  11  . omeprazole (PRILOSEC) 20 MG capsule Take 20 mg by mouth daily as needed.       . tamoxifen (NOLVADEX) 20 MG tablet Take 1 tablet (20 mg total) by mouth daily.  30 tablet  1   No current facility-administered medications for this visit.    OBJECTIVE: middle-aged white woman who appears younger than stated age  34 Vitals:   01/26/14 1603  BP: 115/68  Pulse: 58  Temp: 98.3 F (36.8 C)  Resp: 18     Body mass index is 28.26 kg/(m^2).      ECOG FS: 0 - Asymptomatic  .Sclerae unicteric, pupils equal and reactive Oropharynx clear and moist. No cervical or supraclavicular adenopathy Lungs no rales or rhonchi Heart regular rate and rhythm Abd soft, nontender, positive bowel sounds MSK no focal spinal tenderness, no upper extremity lymphedema Neuro: nonfocal, well oriented, appropriate affect Breasts: The right breast is status post lumpectomy and radiation. The right axilla is  benign. There is no evidence of local recurrence. Left breast is unremarkable   LAB RESULTS: Lab Results  Component Value Date   WBC 10.2 07/14/2012   NEUTROABS 4.2 02/21/2012   HGB 10.1* 07/14/2012   HCT 30.5* 07/14/2012   MCV 89.4 07/14/2012   PLT 217 07/14/2012      Chemistry      Component Value Date/Time   NA 140 07/10/2012 0920   NA 140 02/21/2012 1358   K 4.7 07/10/2012 0920   K 4.2 02/21/2012 1358   CL 106 07/10/2012 0920   CL 105 02/21/2012 1358   CO2 24 07/10/2012 0920   CO2 25 02/21/2012 1358   BUN 14 07/10/2012 0920   BUN 13.0 02/21/2012 1358   CREATININE 0.83 07/10/2012 0920   CREATININE 0.9 02/21/2012 1358      Component Value Date/Time  CALCIUM 9.1 07/10/2012 0920   CALCIUM 9.8 02/21/2012 1358   ALKPHOS 76 02/21/2012 1358   ALKPHOS 61 06/14/2011 0912   AST 21 02/21/2012 1358   AST 26 06/14/2011 0912   ALT 17 02/21/2012 1358   ALT 21 06/14/2011 0912   BILITOT 0.30 02/21/2012 1358   BILITOT 0.3 06/14/2011 0912       Lab Results  Component Value Date   LABCA2 13 02/21/2012    Urinalysis No results found for this basename: colorurine,  appearanceur,  labspec,  phurine,  glucoseu,  hgbur,  bilirubinur,  ketonesur,  proteinur,  urobilinogen,  nitrite,  leukocytesur    STUDIES: CLINICAL DATA: Status post right lumpectomy and radiation therapy  for breast cancer in 2011.  EXAM:  DIGITAL DIAGNOSTIC BILATERAL MAMMOGRAM WITH CAD  COMPARISON: Previous examinations.  ACR Breast Density Category b: There are scattered areas of  fibroglandular density.  FINDINGS:  Right retropectoral and left retroglandular saline implants are  again demonstrated. Stable post lumpectomy changes on the right. No  new findings suspicious for malignancy in either breast.  Mammographic images were processed with CAD.  IMPRESSION:  No evidence of malignancy.  RECOMMENDATION:  Bilateral diagnostic mammogram in 1 year.  I have discussed the findings and recommendations with the  patient.  Results were also provided in writing at the conclusion of the  visit. If applicable, a reminder letter will be sent to the patient  regarding the next appointment.  BI-RADS CATEGORY 2: Benign.  Electronically Signed  By: Enrique Sack M.D.  On: 10/01/2013 10:39     ASSESSMENT: Cassandra Mccann is a  43 y.o. BRCA negative, [place], [place] woman: 1.  Status post right breast needle core biopsy on 09/01/2009 which showed ductal carcinoma in situ with calcifications and necrosis, estrogen receptor 99% positive, progesterone receptor 98% positive.  2.  The patient had bilateral breast MRI on 09/08/2009 which showed background parenchymal enhancement is moderate.  There were multiple foci of enhancement bilaterally.  Within the lateral portion of the right breast, there was an area of nonmass persistent enhancement in the region of recent stereotactic guided core biopsy.  This region measures 1.4 x 1.7 x 0.9 cm.  Within this enhancement was clip artifact from recent stereotactic guided core biopsy.  Images of the left breast were unremarkable.  Within the upper-outer quadrant of the left breast, there was a cluster of calcifications seen mammographically.  There is no discrete enhancement on MRI to correlate with this area of calcifications.  No enlarged or suspicious internal mammary or axillary lymph nodes were identified (clinical stage 0, pTis).  3.  The patient had genetic counseling and testing BRACAnalysis report dated 09/20/2009 showed no mutation detected in BRCA1 or BRCA2 breast cancer genes.  4.  Status post right breast lumpectomy on 10/09/2009 for a stage 0, pTis pNX, 0.4 cm ductal carcinoma in situ intermediate grade, S2 and receptor 99% positive, progesterone receptor 98% positive.  5.  The patient underwent radiation therapy from 11/14/2009 through 01/03/2010.  6.  The patient started antiestrogen therapy with Tamoxifen in 12/2009.   7.  Status post laparoscopic  assisted vaginal hysterectomy on 07/13/2012 (ovaries retained) after discovery of endometrial hyperplasia and fibroid tumors.   PLAN: I spent approximately 35 minutes today reviewing the fact that for noninvasive breast cancer, 5 years of tamoxifen is standard. We do not have data that suggests that 10 years is better in this setting. Cassandra Mccann had consider going for 10 years but at this  point she is very much in favor of stopping at 5 years. She would like to stop specifically in June of 2016, which is close enough, and I will make her an appointment to see me.month so we can "close this chapter" and "get out of the cancer business".  In the meantime she understands that she very likely is still premenopausal, since she is not having significant menopausal side effects other than mild hot flashes. We discussed whether or not to obtain a bone density at this point, but I think that test can be postponed until she turns 48.  Mildred has a good understanding of the overall plan. She agrees with that. She is already planning to where [facility] for her next visit. She knows to call for any problems that may develop before the next visit here.   01/26/2014   Chauncey Cruel, MD

## 2014-01-28 ENCOUNTER — Telehealth: Payer: Self-pay

## 2014-01-28 ENCOUNTER — Other Ambulatory Visit: Payer: Self-pay | Admitting: Family Medicine

## 2014-01-28 MED ORDER — SCOPOLAMINE 1 MG/3DAYS TD PT72: 1.0000 | MEDICATED_PATCH | TRANSDERMAL | Status: DC

## 2014-01-28 NOTE — Progress Notes (Signed)
This is fine. Sent to [place]. Change every 3 days. Wash hands well after putting them on.

## 2014-01-28 NOTE — Progress Notes (Signed)
Cassandra Mccann notified prescription has been sent to [facility] for Scopolamine Patch.  Advised her to change patch every three days and make sure she washes her hands well after putting patch on.

## 2014-01-28 NOTE — Telephone Encounter (Signed)
Pt going on 7 day cruise that starts on 02/03/14 and request sea sick patches to [facility].Pt has never used patches but friends have told her how great they are for sea sickness prevention. Please advise.

## 2014-01-28 NOTE — Telephone Encounter (Signed)
Done, see my note with script

## 2014-07-26 ENCOUNTER — Other Ambulatory Visit: Payer: Self-pay | Admitting: *Deleted

## 2014-07-26 DIAGNOSIS — D0591 Unspecified type of carcinoma in situ of right breast: Secondary | ICD-10-CM

## 2014-07-26 MED ORDER — TAMOXIFEN CITRATE 20 MG PO TABS: 20.0000 mg | ORAL_TABLET | Freq: Every day | ORAL | Status: DC

## 2014-08-31 ENCOUNTER — Other Ambulatory Visit: Payer: Self-pay | Admitting: *Deleted

## 2014-08-31 MED ORDER — ALPRAZOLAM 0.25 MG PO TABS: 0.2500 mg | ORAL_TABLET | Freq: Every day | ORAL | Status: DC | PRN

## 2014-08-31 NOTE — Telephone Encounter (Signed)
Last office visit 11/12/2013. Last refilled 11/12/2013 for #30 with no refills.  Ok to refill?

## 2014-08-31 NOTE — Telephone Encounter (Signed)
Called to [facility].

## 2014-08-31 NOTE — Telephone Encounter (Signed)
Ok to refill 30, 0 ref 

## 2014-10-03 ENCOUNTER — Other Ambulatory Visit: Payer: Self-pay | Admitting: Family Medicine

## 2014-10-03 DIAGNOSIS — Z853 Personal history of malignant neoplasm of breast: Secondary | ICD-10-CM

## 2014-10-06 ENCOUNTER — Other Ambulatory Visit: Payer: Self-pay | Admitting: Family Medicine

## 2014-10-06 ENCOUNTER — Ambulatory Visit
Admission: RE | Admit: 2014-10-06 | Discharge: 2014-10-06 | Disposition: A | Discharge status: Home / Self Care | Payer: BLUE CROSS/BLUE SHIELD | Source: Ambulatory Visit | Attending: Family Medicine | Admitting: Family Medicine

## 2014-10-06 ENCOUNTER — Ambulatory Visit (HOSPITAL_BASED_OUTPATIENT_CLINIC_OR_DEPARTMENT_OTHER): Payer: BLUE CROSS/BLUE SHIELD | Admitting: Oncology

## 2014-10-06 VITALS — BP 137/75 | HR 92 | Temp 97.9°F | Resp 18 | Ht 66.5 in | Wt 190.0 lb

## 2014-10-06 DIAGNOSIS — Z853 Personal history of malignant neoplasm of breast: Secondary | ICD-10-CM | POA: Diagnosis not present

## 2014-10-06 DIAGNOSIS — D0590 Unspecified type of carcinoma in situ of unspecified breast: Secondary | ICD-10-CM

## 2014-10-06 NOTE — Addendum Note (Signed)
Addended by: Prentiss Bells on: 10/06/2014 06:52 PM   Modules accepted: Medications

## 2014-10-06 NOTE — Progress Notes (Signed)
[place]  [phone] [phone]) [phone]  OFFICE PROGRESS NOTE   ID: Derry Skill   DOB: June 30, 1970  MR#: [id number]  DXA#:[id number]   PCP: Owens Loffler, MD GYN: Ralene Bathe. Matthew Saras M.D.  Kasandra KnudsenFenton Malling. Lucia Gaskins, M.D. RAD ONC: Gery Pray, M.D.   HISTORY OF PRESENT ILLNESS: From Dr. Collier Salina Rubin's new patient evaluation note dated 09/06/2009:  "This is a delightful 44 year old from [place] here with her husband, Duane, for evaluation of her recent diagnosis of breast cancer.  This woman has been in good health.  She has undergone baseline mammography at age 20 because of positive family history for breast cancer.  She had a followup mammogram done Sep 01, 2009, which showed suspicious right breast calcifications in the right breast.  A biopsy was recommended.  The patient underwent a biopsy on 05/06, which showed DCIS immediate grade, ER positive, PR positive.  The patient is due to undergo an MRI scan.  Of note is that on the original mammogram, it was reviewed and noted to be some calcifications in the left breast as well and these were also scheduled to be biopsied later on."  Her subsequent history is as detailed below.   INTERVAL HISTORY: Cindie returns today for followup of her noninvasive breast cancer accompanied by her daughter. She continues on tamoxifen, with excellent tolerance. Hot flashes and vaginal dark discharge are not a major issue. She has gained some weight, and she attributes that to the tamoxifen, although we do have randomized data that shows tamoxifen does not cause weight gain (and we compared tamoxifen and placebo, both groups gain the same amount of weight). More likely the real cause of the weight gain is menopause. However we do not know that she is menopausal since she still has her ovaries but does not have a uterus. She may discuss that with Dr. Matthew Saras just for information at their next visit.  REVIEW OF SYSTEMS: A detailed review  of systems today was otherwise noncontributory  PAST MEDICAL HISTORY: Past Medical History  Diagnosis Date  . 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 Hyperlipidemia   . Hypertension   . GERD (gastroesophageal reflux disease)   . Endometrial hyperplasia 06/2012  . Umbilical hernia 05/945  . Cancer     PAST SURGICAL HISTORY: Past Surgical History  Procedure Laterality Date  . Breast surgery  2005    implants  . Breast lumpectomy  2011  . Laparoscopic assisted vaginal hysterectomy N/A 07/13/2012    Procedure: LAPAROSCOPIC ASSISTED VAGINAL HYSTERECTOMY;  Surgeon: Margarette Asal, MD;  Location: [facility];  Service: Gynecology;  Laterality: N/A;  . Abdominal hysterectomy  [id number]  . Hernia repair      FAMILY HISTORY Family History  Problem Relation Age of Onset  . Cancer Mother     Breast  . Stroke Mother   . Hypertension Mother   . Cancer Father     bladder  Father is deceased from complications of bladder cancer.  Mother had a history of breast cancer in late 63s and died at age 43.  She has a half-brother who is in good health.  GYNECOLOGIC HISTORY: G2 P2, menarche age 47, age of parity 40, laparoscopic hysterectomy in 06/2012 with ovaries retained, birth control pill use from ages 60 through 89.  No history of hormone replacement therapy.  Her husband has had a vasectomy.  SOCIAL HISTORY: Married to Linden since 2006.  She has  2 teenage children.  The patient is a Copywriter, advertising.  Her husband previously worked [employer] for [employer] and works at [employer]  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  They attend [employer].  In her spare time she enjoys shopping and going to the beach.  ADVANCED DIRECTIVES: In place - joint medical power of attorney between her husband Dorothea Yow and her friend Gloris Ham who may be reached at [phone].  HEALTH MAINTENANCE: History  Substance Use Topics  . Smoking  status: Never Smoker   . Smokeless tobacco: Never Used  . Alcohol Use: Yes     Comment: occasionally    Colonoscopy: N/A PAP: Not on file Bone density: N/A Lipid panel: 03/18/2012  Allergies  Allergen Reactions  . Codeine Other (See Comments)    Unknown. Can take Percocet.  Marland Kitchen Penicillins Other (See Comments)    Unknown childhood reaction  . Sulfonamide Derivatives Nausea And Vomiting    Current Outpatient Prescriptions  Medication Sig Dispense Refill  . ALPRAZolam (XANAX) 0.25 MG tablet Take 1 tablet (0.25 mg total) by mouth daily as needed for anxiety. 30 tablet 0  . ibuprofen (ADVIL,MOTRIN) 800 MG tablet Take 1 tablet (800 mg total) by mouth every 8 (eight) hours as needed (mild pain). 30 tablet 2  . lisinopril (PRINIVIL,ZESTRIL) 10 MG tablet Take 0.5 tablets (5 mg total) by mouth daily. 30 tablet 11  . omeprazole (PRILOSEC) 20 MG capsule Take 20 mg by mouth daily as needed.     Marland Kitchen scopolamine (TRANSDERM-SCOP) 1 MG/3DAYS Place 1 patch (1.5 mg total) onto the skin every 3 (three) days. 10 patch 1  . tamoxifen (NOLVADEX) 20 MG tablet Take 1 tablet (20 mg total) by mouth daily. 30 tablet 3   No current facility-administered medications for this visit.    OBJECTIVE: middle-aged white woman In no acute distress  Filed Vitals:   10/06/14 1512  BP: 137/75  Pulse: 92  Temp: 97.9 F (36.6 C)  Resp: 18     Body mass index is 30.21 kg/(m^2).      ECOG FS: 0 - Asymptomatic  Sclerae unicteric, EOMs intact Oropharynx clear, dentition in excellent repair No cervical or supraclavicular adenopathy Lungs no rales or rhonchi Heart regular rate and rhythm Abd soft, nontender, positive bowel sounds MSK no focal spinal tenderness, no upper extremity lymphedema Neuro: nonfocal, well oriented, appropriate affect Breasts: The right breast is status post lumpectomy and radiation. There is no evidence of local recurrence. The right axilla is benign per the left breast is  unremarkable   LAB RESULTS: Lab Results  Component Value Date   WBC 10.2 07/14/2012   NEUTROABS 4.2 02/21/2012   HGB 10.1* 07/14/2012   HCT 30.5* 07/14/2012   MCV 89.4 07/14/2012   PLT 217 07/14/2012      Chemistry      Component Value Date/Time   NA 140 07/10/2012 0920   NA 140 02/21/2012 1358   K 4.7 07/10/2012 0920   K 4.2 02/21/2012 1358   CL 106 07/10/2012 0920   CL 105 02/21/2012 1358   CO2 24 07/10/2012 0920   CO2 25 02/21/2012 1358   BUN 14 07/10/2012 0920   BUN 13.0 02/21/2012 1358   CREATININE 0.83 07/10/2012 0920   CREATININE 0.9 02/21/2012 1358      Component Value Date/Time   CALCIUM 9.1 07/10/2012 0920   CALCIUM 9.8 02/21/2012 1358   ALKPHOS 76 02/21/2012 1358   ALKPHOS 61 06/14/2011  0912   AST 21 02/21/2012 1358   AST 26 06/14/2011 0912   ALT 17 02/21/2012 1358   ALT 21 06/14/2011 0912   BILITOT 0.30 02/21/2012 1358   BILITOT 0.3 06/14/2011 0912       Lab Results  Component Value Date   LABCA2 13 02/21/2012    Urinalysis No results found for: COLORURINE  STUDIES: Mm Diag Breast W/implant Tomo Bilateral  10/06/2014   CLINICAL DATA:  History of right lumpectomy 2011. Radiation therapy. Tamoxifen for 5 years.  EXAM: DIGITAL DIAGNOSTIC BILATERAL MAMMOGRAM WITH IMPLANTS, 3D TOMOSYNTHESIS WITH CAD  The patient has retropectoral implants. Standard and implant displaced views were performed.  COMPARISON:  10/01/2013 and earlier  ACR Breast Density Category b: There are scattered areas of fibroglandular density.  FINDINGS: No suspicious mass, distortion, or microcalcifications are identified to suggest presence of malignancy. The patient has bilateral retropectoral saline implants.  Mammographic images were processed with CAD.  IMPRESSION: No mammographic evidence for malignancy.  RECOMMENDATION: Diagnostic mammogram is suggested in 1 year. (Code:DM-B-01Y)  I have discussed the findings and recommendations with the patient. Results were also provided in  writing at the conclusion of the visit. If applicable, a reminder letter will be sent to the patient regarding the next appointment.  BI-RADS CATEGORY  2: Benign.   Electronically Signed   By: Nolon Nations M.D.   On: 10/06/2014 11:23     ASSESSMENT: Mrs. Bradshaw is a  44 y.o. BRCA negative, [place], [place] woman:  1.  Status post right breast needle core biopsy on 09/01/2009  showed ductal carcinoma in situ, estrogen receptor 99% positive, progesterone receptor 98% positive.  2.  genetic testing 09/20/2009 showed no mutation in BRCA1 or BRCA2   4.  Status post right lumpectomy  10/09/2009 for a stage 0, pTis pNX, 0.4 cm ductal carcinoma in situ, intermediate grade, with close but negative margins  5. Received adjuvant  radiation therapy from 11/14/2009 through 01/03/2010.  6.  The patient started antiestrogen therapy with Tamoxifen in 12/2009, discontinued 10/06/2014  7.  Status post laparoscopic assisted vaginal hysterectomy on 07/13/2012 (ovaries retained) after discovery of endometrial hyperplasia and fibroid tumors.   PLAN: [place] has completed 5 years of antiestrogen therapy and is now ready to "graduate" from follow-up here.  While we have data for 10 years of antiestrogen therapy on invasive disease, we do not have similar data on noninvasive, in situ breast cancer. We reviewed the fact that this cancer would not be able to travel to a vital organ and so was not life-threatening. By taking the antiestrogen she has also cut in half her risk of developing a new breast cancer in either breast.  All she will need in terms of breast cancer follow-up is yearly mammography, preferably with tomography, and a yearly physician breast exam.  I will be glad to see [place] at any point in the future if and when the need arises, but as of now we are making no further routine appointments for her here.  10/06/2014   Chauncey Cruel, MD

## 2014-12-01 ENCOUNTER — Telehealth: Payer: Self-pay | Admitting: *Deleted

## 2014-12-01 ENCOUNTER — Telehealth: Payer: Self-pay | Admitting: Family Medicine

## 2014-12-01 MED ORDER — LISINOPRIL 10 MG PO TABS: 5.0000 mg | ORAL_TABLET | Freq: Every day | ORAL | Status: DC

## 2014-12-01 NOTE — Telephone Encounter (Signed)
Called to schedule appt, no answer, left message

## 2014-12-01 NOTE — Telephone Encounter (Signed)
Please call and schedule CPE with fasting labs prior with Dr. Copland.  

## 2014-12-01 NOTE — Telephone Encounter (Signed)
Pt scheduled 10/21 for cpe with dr Diona Browner. Pt aware

## 2014-12-28 ENCOUNTER — Telehealth: Payer: Self-pay | Admitting: *Deleted

## 2014-12-28 NOTE — Telephone Encounter (Signed)
Pt called and left message wanting to talk to nurse.  Spoke with pt and was informed that pt had completed 5 yrs of antiestrogen therapy.   This am, pt noticed a " rough surface, bumpy rash about 1 inch from Right breast incision - pt stated looks like poison ivy rash ".   Denied itching, denied swelling, tender to touch with mild redness.   Pt was concerned and would like to ask Dr. Jana Hakim to evaluate further.   Message sent to Dr. Jana Hakim and Erline Levine, desk nurse today. Pt's  Phone    [phone].

## 2014-12-28 NOTE — Telephone Encounter (Signed)
Spoke with Dr. Virgie Dad colab nurse and [facility]- pt should follow up with PCP in regards to rash/bumps. Returned call to patient to advise follow up needed with PCP. Pt understands and denies questions. Advised pt to call us if she is unable to get into her PCP this week.

## 2015-02-08 ENCOUNTER — Other Ambulatory Visit: Payer: Self-pay | Admitting: *Deleted

## 2015-02-08 MED ORDER — LISINOPRIL 10 MG PO TABS: 5.0000 mg | ORAL_TABLET | Freq: Every day | ORAL | Status: DC

## 2015-02-12 ENCOUNTER — Ambulatory Visit (INDEPENDENT_AMBULATORY_CARE_PROVIDER_SITE_OTHER): Payer: BLUE CROSS/BLUE SHIELD | Admitting: Urgent Care

## 2015-02-12 VITALS — BP 130/80 | HR 70 | Temp 98.2°F | Resp 18 | Ht 66.25 in | Wt 192.4 lb

## 2015-02-12 DIAGNOSIS — R0982 Postnasal drip: Secondary | ICD-10-CM

## 2015-02-12 DIAGNOSIS — J029 Acute pharyngitis, unspecified: Secondary | ICD-10-CM

## 2015-02-12 DIAGNOSIS — R0981 Nasal congestion: Secondary | ICD-10-CM | POA: Diagnosis not present

## 2015-02-12 DIAGNOSIS — R05 Cough: Secondary | ICD-10-CM | POA: Diagnosis not present

## 2015-02-12 DIAGNOSIS — J329 Chronic sinusitis, unspecified: Secondary | ICD-10-CM | POA: Diagnosis not present

## 2015-02-12 DIAGNOSIS — J302 Other seasonal allergic rhinitis: Secondary | ICD-10-CM

## 2015-02-12 DIAGNOSIS — R059 Cough, unspecified: Secondary | ICD-10-CM

## 2015-02-12 LAB — POCT RAPID STREP A (OFFICE): Rapid Strep A Screen: NEGATIVE

## 2015-02-12 MED ORDER — CETIRIZINE HCL 10 MG PO TABS: 10.0000 mg | ORAL_TABLET | Freq: Every day | ORAL | Status: DC

## 2015-02-12 MED ORDER — PSEUDOEPHEDRINE HCL ER 120 MG PO TB12: 120.0000 mg | ORAL_TABLET | Freq: Two times a day (BID) | ORAL | Status: DC

## 2015-02-12 MED ORDER — HYDROCODONE-HOMATROPINE 5-1.5 MG/5ML PO SYRP: 5.0000 mL | ORAL_SOLUTION | Freq: Every evening | ORAL | Status: DC | PRN

## 2015-02-12 NOTE — Patient Instructions (Signed)

## 2015-02-12 NOTE — Progress Notes (Signed)
    MRN: [id number] DOB: June 14, 1970  Subjective:   Cassandra Mccann is a 44 y.o. female presenting for chief complaint of Sore Throat and Cough  Reports 2 week history of sinus congestion, sinus headache, dry hacking cough, now having worsening sore throat, difficulty swallowing, neck pain, hoarseness. Has had multiple sick contacts. Has tried [employer], decongestant with some relief of congestion and headache. Denies chest pain, shob, wheezing, n/v, abdominal pain, ear pain, ear drainage, itchy or red eyes. Patient admits her congestion is improved. Denies any other aggravating or relieving factors, no other questions or concerns.  Cassandra Mccann has a current medication list which includes the following prescription(s): alprazolam, ibuprofen, lisinopril, omeprazole, phentermine-topiramate, fluocinonide cream, scopolamine, and tamoxifen. Also is allergic to codeine; penicillins; and sulfonamide derivatives.  Cassandra Mccann  has a past medical history of Ductal carcinoma in situ of breast (2011); Hyperlipidemia; Hypertension; GERD (gastroesophageal reflux disease); Endometrial hyperplasia (06/2012); Umbilical hernia (08/3612); Cancer ([place]); and Allergy. Also  has past surgical history that includes Breast surgery (2005); Breast lumpectomy (2011); Laparoscopic assisted vaginal hysterectomy (N/A, 07/13/2012); Abdominal hysterectomy ([id number]); and Hernia repair.  Objective:   Vitals: BP 130/80 mmHg  Pulse 70  Temp(Src) 98.2 F (36.8 C) (Oral)  Resp 18  Ht 5' 6.25" (1.683 m)  Wt 192 lb 6 oz (87.261 kg)  BMI 30.81 kg/m2  SpO2 98%  LMP 06/23/2012  Physical Exam  Constitutional: She is oriented to person, place, and time. She appears well-developed and well-nourished.  HENT:  TM's intact bilaterally, no effusions or erythema. Nasal turbinates pink and moist. No sinus tenderness. Postnasal drip present, without oropharyngeal exudates, erythema or abscesses.  Eyes: Right eye exhibits no discharge. Left eye exhibits no  discharge. No scleral icterus.  Neck: Normal range of motion. Neck supple.  Cardiovascular: Normal rate, regular rhythm and intact distal pulses.  Exam reveals no gallop and no friction rub.   No murmur heard. Pulmonary/Chest: No respiratory distress. She has no wheezes. She has no rales.  Lymphadenopathy:    She has cervical adenopathy (bilateral, anterior, L>R).  Neurological: She is alert and oriented to person, place, and time.  Skin: Skin is warm and dry. No rash noted. No erythema. No pallor.  Psychiatric: She has a normal mood and affect.   Results for orders placed or performed in visit on 02/12/15 (from the past 24 hour(s))  POCT rapid strep A     Status: None   Collection Time: 02/12/15  1:43 PM  Result Value Ref Range   Rapid Strep A Screen Negative Negative   Assessment and Plan :   1. Sore throat 2. Sinus congestion 3. Post-nasal drainage 4. Cough 5. Seasonal allergies - Patient likely recovering from viral syndrome, symptoms worsened by uncontrolled allergies. Start Zyrtec, Sudafed as needed for post-nasal drainage. Strep culture pending, rx antibiotic course if no improvement in 1 week.  Jaynee Eagles, PA-C [facility] [phone] 02/12/2015 12:58 PM

## 2015-02-14 ENCOUNTER — Telehealth: Payer: Self-pay

## 2015-02-14 LAB — CULTURE, GROUP A STREP: Organism ID, Bacteria: NORMAL

## 2015-02-14 NOTE — Telephone Encounter (Signed)
PATIENT STATES SHE SAW MARIO MANI ON Sunday FOR A SORE THROAT. HER QUICK STREP CAME BACK NEGATIVE, BUT HE WAS GOING TO SEND IT OFF AS WELL. SHE WOULD LIKE TO GET THOSE RESULTS PLEASE. SHE SAID SHE DOES NOT FEEL BETTER. MARIO SAID IT MAY BE ALLERGIES, BUT HER THROAT IS STILL SORE AND SHE FEELS WEAK.  PLEASE LEAVE A MESSAGE ON HER MACHINE IF SHE DOES NOT ANSWER WHEN WE RETURN HER CALL. BEST PHONE [phone] (CELL)  PHARMACY CHOICE IS [facility]   [place]

## 2015-02-15 NOTE — Telephone Encounter (Signed)
Bess Harvest spoke with pt already.

## 2015-02-17 ENCOUNTER — Ambulatory Visit (INDEPENDENT_AMBULATORY_CARE_PROVIDER_SITE_OTHER): Payer: BLUE CROSS/BLUE SHIELD | Admitting: Family Medicine

## 2015-02-17 ENCOUNTER — Encounter: Payer: Self-pay | Admitting: Family Medicine

## 2015-02-17 VITALS — BP 124/84 | HR 73 | Temp 98.5°F | Ht 66.0 in | Wt 194.2 lb

## 2015-02-17 DIAGNOSIS — J069 Acute upper respiratory infection, unspecified: Secondary | ICD-10-CM | POA: Diagnosis not present

## 2015-02-17 DIAGNOSIS — B9789 Other viral agents as the cause of diseases classified elsewhere: Secondary | ICD-10-CM

## 2015-02-17 DIAGNOSIS — F419 Anxiety disorder, unspecified: Secondary | ICD-10-CM

## 2015-02-17 DIAGNOSIS — J309 Allergic rhinitis, unspecified: Secondary | ICD-10-CM | POA: Insufficient documentation

## 2015-02-17 DIAGNOSIS — I10 Essential (primary) hypertension: Secondary | ICD-10-CM | POA: Diagnosis not present

## 2015-02-17 DIAGNOSIS — Z0000 Encounter for general adult medical examination without abnormal findings: Secondary | ICD-10-CM

## 2015-02-17 DIAGNOSIS — E7800 Pure hypercholesterolemia, unspecified: Secondary | ICD-10-CM | POA: Diagnosis not present

## 2015-02-17 NOTE — Assessment & Plan Note (Signed)
Well controlled. Continue current medication.  

## 2015-02-17 NOTE — Patient Instructions (Addendum)
Schedule fasting labs in next few weeks.  Start exercise as able. Work on low [employer].  Continue zyrtec at bedtime. Start flonase 2 sprays per nostril daily. Nasal saline irrigation. Cough suppressant at night. Call if not improving in next 3-4 days for consideration of antibiotics.

## 2015-02-17 NOTE — Progress Notes (Signed)
Pre visit review using our clinic review tool, if applicable. No additional management support is needed unless otherwise documented below in the visit note. 

## 2015-02-17 NOTE — Assessment & Plan Note (Signed)
Due for re-eval. 

## 2015-02-17 NOTE — Progress Notes (Signed)
44 year old female pt presents for annual eval of chronic issues. In past: DCIS of breast, disease free  GYN (Dr. Matthew Saras) - does pelvic and breast Now off tamoxifem.   She recently went to urgent care with sore throat neg strep test.  Runny nose for 2 weeks. Dx with allergies. Started zyrtec, sudafed and given cough med. Hoarse voice, decrease energy in last week. Cough is progressively worse in last 24 hours. No fever.  No SOB.  Anxiety, improved control: Using alprazolam prn about once every few weeks.    Elevated Cholesterol: Has not done this year.  2015: Tot 172, trig 53, HDL 84, LDL 108 Diet compliance: Moderate, does go throiugh spells of poor eating Exercise: None Other complaints:  Hypertension: Well controlled on lisinopril BP Readings from Last 3 Encounters:  02/12/15 130/80  10/06/14 137/75  01/26/14 115/68  Using medication without problems or lightheadedness: None Chest pain with exertion:None Edema:None Short of breath:None Average home BPs: not checking Other issues:  Wt Readings from Last 3 Encounters:  02/17/15 194 lb 4 oz (88.111 kg)  02/12/15 192 lb 6 oz (87.261 kg)  10/06/14 190 lb (86.183 kg)  She has started on phentermine/topiramate in last month.   Review of Systems  Constitutional: Negative for fever and fatigue.  HENT: Negative for ear pain.  Eyes: Negative for pain.  Respiratory: Negative for chest tightness and shortness of breath.  Cardiovascular: Negative for chest pain, palpitations and leg swelling.  Gastrointestinal: Negative for abdominal pain.  Genitourinary: Negative for dysuria.       Objective:   Physical Exam  Constitutional: Vital signs are normal. She appears well-developed and well-nourished. She is cooperative. Non-toxic appearance. She does not appear ill. No distress.  HENT:  Head: Normocephalic.  Right Ear: Hearing nml, tympanic membrane with clear fluid, external ear and ear canal normal.  Left Ear:  Hearing nml , tympanic membrane with clear fluid, external ear and ear canal normal.  Nose: pale turbinates, mucus.  Eyes: Conjunctivae, EOM and lids are normal. Pupils are equal, round, and reactive to light. Lids are everted and swept, no foreign bodies found.  Neck: Trachea normal and normal range of motion. Neck supple. Carotid bruit is not present. No mass and no thyromegaly present.  Cardiovascular: Normal rate, regular rhythm, S1 normal, S2 normal, normal heart sounds and intact distal pulses. Exam reveals no gallop.  No murmur heard. Pulmonary/Chest: Effort normal and breath sounds normal. No respiratory distress. She has no wheezes. She has no rhonchi. She has no rales.  OCC cough Abdominal: Soft. Normal appearance and bowel sounds are normal. She exhibits no distension, no fluid wave, no abdominal bruit and no mass. There is no hepatosplenomegaly. There is no tenderness. There is no rebound, no guarding and no CVA tenderness. No hernia.  Lymphadenopathy:   She has no cervical adenopathy.   She has no axillary adenopathy.  Neurological: She is alert. She has normal strength. No cranial nerve deficit or sensory deficit.  Skin: Skin is warm, dry and intact. No rash noted.  Psychiatric: Her speech is normal and behavior is normal. Judgment normal. Her mood appears not anxious. Cognition and memory are normal. She does not exhibit a depressed mood.          Assessment & Plan:  The patient's preventative maintenance and recommended screening tests for an annual wellness exam were reviewed in full today. Brought up to date unless services declined.  Counselled on the importance of diet, exercise, and its  role in overall health and mortality. The patient's FH and SH was reviewed, including their home life, tobacco status, and drug and alcohol status.   GYN: partial hysterectomy for endometrial hyperplasia  Mammo:due,  on tamoxifen for DCIS now in remission Vaccine: Uptodate  with Hep B and Hep A, ?TDap, due for flu No early colon cancer.  HIV screen:

## 2015-02-17 NOTE — Assessment & Plan Note (Signed)
Symptomatic care 

## 2015-02-17 NOTE — Assessment & Plan Note (Signed)
Stable control. 

## 2015-02-17 NOTE — Assessment & Plan Note (Signed)
Continue zyrtec and add flonase.

## 2015-03-02 ENCOUNTER — Telehealth: Payer: Self-pay | Admitting: Family Medicine

## 2015-03-02 DIAGNOSIS — E7800 Pure hypercholesterolemia, unspecified: Secondary | ICD-10-CM

## 2015-03-02 NOTE — Telephone Encounter (Signed)
-----   Message from Ellamae Sia sent at 02/28/2015  8:46 AM EDT ----- Regarding: Lab orders for Friday, 11.4.16 Lab appt, no f/u appt

## 2015-03-03 ENCOUNTER — Other Ambulatory Visit (INDEPENDENT_AMBULATORY_CARE_PROVIDER_SITE_OTHER): Payer: BLUE CROSS/BLUE SHIELD

## 2015-03-03 DIAGNOSIS — E7800 Pure hypercholesterolemia, unspecified: Secondary | ICD-10-CM | POA: Diagnosis not present

## 2015-03-03 LAB — COMPREHENSIVE METABOLIC PANEL
ALBUMIN: 4 g/dL (ref 3.5–5.2)
ALK PHOS: 83 U/L (ref 39–117)
ALT: 18 U/L (ref 0–35)
AST: 18 U/L (ref 0–37)
BUN: 13 mg/dL (ref 6–23)
CO2: 26 mEq/L (ref 19–32)
Calcium: 9.3 mg/dL (ref 8.4–10.5)
Chloride: 107 mEq/L (ref 96–112)
Creatinine, Ser: 0.78 mg/dL (ref 0.40–1.20)
GFR: 84.32 mL/min (ref >60.00)
Glucose, Bld: 84 mg/dL (ref 70–99)
POTASSIUM: 4.9 meq/L (ref 3.5–5.1)
Sodium: 139 mEq/L (ref 135–145)
TOTAL PROTEIN: 6.5 g/dL (ref 6.0–8.3)
Total Bilirubin: 0.6 mg/dL (ref 0.2–1.2)

## 2015-03-03 LAB — LIPID PANEL
CHOLESTEROL: 211 mg/dL — AB (ref 0–200)
HDL: 68.8 mg/dL (ref >39.00)
LDL Cholesterol: 129 mg/dL — ABNORMAL HIGH (ref 0–99)
NonHDL: 141.92
Total CHOL/HDL Ratio: 3
Triglycerides: 63 mg/dL (ref 0.0–149.0)
VLDL: 12.6 mg/dL (ref 0.0–40.0)

## 2015-03-07 ENCOUNTER — Encounter: Payer: Self-pay | Admitting: *Deleted

## 2015-04-21 ENCOUNTER — Other Ambulatory Visit: Payer: Self-pay | Admitting: *Deleted

## 2015-04-21 MED ORDER — LISINOPRIL 10 MG PO TABS: 5.0000 mg | ORAL_TABLET | Freq: Every day | ORAL | Status: DC

## 2015-04-21 MED ORDER — ALPRAZOLAM 0.25 MG PO TABS: 0.2500 mg | ORAL_TABLET | Freq: Every day | ORAL | Status: AC | PRN

## 2015-04-21 NOTE — Telephone Encounter (Signed)
Last filled #30 08/31/14.  CPE on 02/17/15.

## 2015-04-21 NOTE — Telephone Encounter (Signed)
Prescription called to pharmacy.

## 2015-06-20 ENCOUNTER — Other Ambulatory Visit: Payer: Self-pay | Admitting: *Deleted

## 2015-06-20 MED ORDER — LISINOPRIL 10 MG PO TABS: 5.0000 mg | ORAL_TABLET | Freq: Every day | ORAL | Status: DC

## 2015-11-24 ENCOUNTER — Other Ambulatory Visit: Payer: Self-pay | Admitting: Obstetrics and Gynecology

## 2015-11-24 DIAGNOSIS — Z853 Personal history of malignant neoplasm of breast: Secondary | ICD-10-CM

## 2015-12-08 ENCOUNTER — Ambulatory Visit
Admission: RE | Admit: 2015-12-08 | Discharge: 2015-12-08 | Disposition: A | Discharge status: Home / Self Care | Payer: BLUE CROSS/BLUE SHIELD | Source: Ambulatory Visit | Attending: Obstetrics and Gynecology | Admitting: Obstetrics and Gynecology

## 2015-12-08 DIAGNOSIS — Z85828 Personal history of other malignant neoplasm of skin: Secondary | ICD-10-CM | POA: Diagnosis not present

## 2015-12-08 DIAGNOSIS — Z853 Personal history of malignant neoplasm of breast: Secondary | ICD-10-CM

## 2015-12-08 DIAGNOSIS — H61009 Unspecified perichondritis of external ear, unspecified ear: Secondary | ICD-10-CM | POA: Diagnosis not present

## 2015-12-08 DIAGNOSIS — C44519 Basal cell carcinoma of skin of other part of trunk: Secondary | ICD-10-CM | POA: Diagnosis not present

## 2015-12-08 DIAGNOSIS — L910 Hypertrophic scar: Secondary | ICD-10-CM | POA: Diagnosis not present

## 2015-12-08 DIAGNOSIS — R928 Other abnormal and inconclusive findings on diagnostic imaging of breast: Secondary | ICD-10-CM | POA: Diagnosis not present

## 2015-12-22 ENCOUNTER — Other Ambulatory Visit: Payer: Self-pay | Admitting: *Deleted

## 2015-12-22 MED ORDER — LISINOPRIL 10 MG PO TABS: 5.0000 mg | ORAL_TABLET | Freq: Every day | ORAL | 0 refills | Status: AC

## 2015-12-25 DIAGNOSIS — Z6831 Body mass index (BMI) 31.0-31.9, adult: Secondary | ICD-10-CM | POA: Diagnosis not present

## 2015-12-25 DIAGNOSIS — Z01419 Encounter for gynecological examination (general) (routine) without abnormal findings: Secondary | ICD-10-CM | POA: Diagnosis not present

## 2015-12-29 DIAGNOSIS — F418 Other specified anxiety disorders: Secondary | ICD-10-CM | POA: Diagnosis not present

## 2015-12-29 DIAGNOSIS — E669 Obesity, unspecified: Secondary | ICD-10-CM | POA: Diagnosis not present

## 2015-12-29 DIAGNOSIS — Z23 Encounter for immunization: Secondary | ICD-10-CM | POA: Diagnosis not present

## 2015-12-29 DIAGNOSIS — Z853 Personal history of malignant neoplasm of breast: Secondary | ICD-10-CM | POA: Diagnosis not present

## 2015-12-29 DIAGNOSIS — Z1389 Encounter for screening for other disorder: Secondary | ICD-10-CM | POA: Diagnosis not present

## 2015-12-29 DIAGNOSIS — I10 Essential (primary) hypertension: Secondary | ICD-10-CM | POA: Diagnosis not present

## 2016-01-19 DIAGNOSIS — C44519 Basal cell carcinoma of skin of other part of trunk: Secondary | ICD-10-CM | POA: Diagnosis not present

## 2016-01-19 DIAGNOSIS — L905 Scar conditions and fibrosis of skin: Secondary | ICD-10-CM | POA: Diagnosis not present

## 2016-02-15 ENCOUNTER — Telehealth: Payer: Self-pay | Admitting: Family Medicine

## 2016-02-15 DIAGNOSIS — E7800 Pure hypercholesterolemia, unspecified: Secondary | ICD-10-CM

## 2016-02-15 NOTE — Telephone Encounter (Signed)
-----   Message from Ellamae Sia sent at 02/08/2016 12:12 PM EDT ----- Regarding: Lab orders for Friday, 10.20.17 Patient is scheduled for CPX labs, please order future labs, Thanks , Karna Christmas

## 2016-02-16 ENCOUNTER — Other Ambulatory Visit: Payer: BLUE CROSS/BLUE SHIELD

## 2016-02-23 ENCOUNTER — Other Ambulatory Visit: Payer: Self-pay | Admitting: Urgent Care

## 2016-02-23 ENCOUNTER — Encounter: Payer: BLUE CROSS/BLUE SHIELD | Admitting: Family Medicine

## 2016-02-23 DIAGNOSIS — J302 Other seasonal allergic rhinitis: Secondary | ICD-10-CM

## 2016-03-01 DIAGNOSIS — L905 Scar conditions and fibrosis of skin: Secondary | ICD-10-CM | POA: Diagnosis not present

## 2016-03-01 DIAGNOSIS — Z85828 Personal history of other malignant neoplasm of skin: Secondary | ICD-10-CM | POA: Diagnosis not present

## 2016-03-13 DIAGNOSIS — M47812 Spondylosis without myelopathy or radiculopathy, cervical region: Secondary | ICD-10-CM | POA: Diagnosis not present

## 2016-03-13 NOTE — Telephone Encounter (Signed)
Opened in error

## 2016-03-18 ENCOUNTER — Other Ambulatory Visit: Payer: Self-pay | Admitting: *Deleted

## 2016-03-18 NOTE — Telephone Encounter (Signed)
Last office visit 02/17/2015.  Ms. Dasgupta cancelled her CPE scheduled for 02/23/2016.  No future appointments.  Refill?

## 2016-06-21 DIAGNOSIS — I10 Essential (primary) hypertension: Secondary | ICD-10-CM | POA: Diagnosis not present

## 2016-06-21 DIAGNOSIS — Z0000 Encounter for general adult medical examination without abnormal findings: Secondary | ICD-10-CM | POA: Diagnosis not present

## 2016-06-28 DIAGNOSIS — Z1389 Encounter for screening for other disorder: Secondary | ICD-10-CM | POA: Diagnosis not present

## 2016-06-28 DIAGNOSIS — Z0000 Encounter for general adult medical examination without abnormal findings: Secondary | ICD-10-CM | POA: Diagnosis not present

## 2016-06-28 DIAGNOSIS — E668 Other obesity: Secondary | ICD-10-CM | POA: Diagnosis not present

## 2016-06-28 DIAGNOSIS — J3089 Other allergic rhinitis: Secondary | ICD-10-CM | POA: Diagnosis not present

## 2016-06-28 DIAGNOSIS — K219 Gastro-esophageal reflux disease without esophagitis: Secondary | ICD-10-CM | POA: Diagnosis not present

## 2016-06-28 DIAGNOSIS — C4491 Basal cell carcinoma of skin, unspecified: Secondary | ICD-10-CM | POA: Diagnosis not present

## 2016-07-10 LAB — IFOBT (OCCULT BLOOD): IFOBT: POSITIVE

## 2016-07-11 DIAGNOSIS — Z1212 Encounter for screening for malignant neoplasm of rectum: Secondary | ICD-10-CM | POA: Diagnosis not present

## 2016-08-02 ENCOUNTER — Ambulatory Visit (INDEPENDENT_AMBULATORY_CARE_PROVIDER_SITE_OTHER): Payer: BLUE CROSS/BLUE SHIELD | Admitting: Family Medicine

## 2016-08-02 ENCOUNTER — Encounter: Payer: Self-pay | Admitting: Family Medicine

## 2016-08-02 VITALS — BP 115/76 | HR 58 | Ht 67.0 in | Wt 185.0 lb

## 2016-08-02 DIAGNOSIS — M722 Plantar fascial fibromatosis: Secondary | ICD-10-CM

## 2016-08-02 MED ORDER — METHYLPREDNISOLONE ACETATE 40 MG/ML IJ SUSP
40.0000 mg | Freq: Once | INTRAMUSCULAR | Status: AC
  Administered 2016-08-02: 40 mg via INTRA_ARTICULAR

## 2016-08-02 NOTE — Patient Instructions (Signed)
You have plantar fasciitis Take tylenol or aleve as needed for pain  Plantar fascia stretch for 20-30 seconds (do 3 of these) in morning Lowering/raise on a step exercises 3 x 10 once or twice a day - this is very important for long term recovery. Can add heel walks, toe walks forward and backward as well Ice heel for 15 minutes as needed. Avoid flat shoes/barefoot walking as much as possible. Arch binders have been shown to help with pain. Wear green insoles as much as possible. Steroid injection is a consideration for short term pain relief if you are struggling - you were given this today. Physical therapy is also an option. Have fun on your cruise! Follow up typically in 6 weeks or as needed.

## 2016-08-05 DIAGNOSIS — M722 Plantar fascial fibromatosis: Secondary | ICD-10-CM | POA: Insufficient documentation

## 2016-08-05 NOTE — Progress Notes (Signed)
PCP: Eliezer Lofts, MD  Subjective:   HPI: Patient is a 46 y.o. female here for right foot pain.  Patient denies known injury or trauma. She states she's had 2-3 months of plantar right foot pain. Pain is 6/10, sharp. Had similar problem years ago - responded to sports insoles with heel lifts and scaphoid pads - still using these. Feels worse in mornings. Using ice which helps. No skin changes, numbness.  Past Medical History:  Diagnosis Date  . Allergy   . Cancer ([place])   . 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 Endometrial hyperplasia 06/2012  . GERD (gastroesophageal reflux disease)   . Hyperlipidemia   . Hypertension   . Umbilical hernia 06/5327    Current Outpatient Prescriptions on File Prior to Visit  Medication Sig Dispense Refill  . ALPRAZolam (XANAX) 0.25 MG tablet Take 1 tablet (0.25 mg total) by mouth daily as needed for anxiety. 30 tablet 0  . cetirizine (ZYRTEC) 10 MG tablet TAKE 1 TABLET(10 MG) BY MOUTH DAILY 30 tablet 0  . HYDROcodone-homatropine (HYCODAN) 5-1.5 MG/5ML syrup Take 5 mLs by mouth at bedtime as needed. 120 mL 0  . ibuprofen (ADVIL,MOTRIN) 800 MG tablet Take 1 tablet (800 mg total) by mouth every 8 (eight) hours as needed (mild pain). 30 tablet 2  . lisinopril (PRINIVIL,ZESTRIL) 10 MG tablet Take 0.5 tablets (5 mg total) by mouth daily. 45 tablet 0  . omeprazole (PRILOSEC) 20 MG capsule Take 20 mg by mouth daily as needed.     . Phentermine-Topiramate (QSYMIA PO) Take by mouth.    . pseudoephedrine (SUDAFED 12 HOUR) 120 MG 12 hr tablet Take 1 tablet (120 mg total) by mouth 2 (two) times daily. 30 tablet 3   No current facility-administered medications on file prior to visit.     Past Surgical History:  Procedure Laterality Date  . ABDOMINAL HYSTERECTOMY  [id number]  . BREAST LUMPECTOMY  2011  . BREAST SURGERY  2005   implants  . HERNIA REPAIR    . LAPAROSCOPIC ASSISTED VAGINAL HYSTERECTOMY N/A 07/13/2012   Procedure:  LAPAROSCOPIC ASSISTED VAGINAL HYSTERECTOMY;  Surgeon: Margarette Asal, MD;  Location: [facility];  Service: Gynecology;  Laterality: N/A;    Allergies  Allergen Reactions  . Codeine Other (See Comments)    Unknown. Can take Percocet.  Marland Kitchen Penicillins Other (See Comments)    Unknown childhood reaction  . Sulfonamide Derivatives Nausea And Vomiting    Social History   Social History  . Marital status: Married    Spouse name: N/A  . Number of children: N/A  . Years of education: N/A   Occupational History  . Not on file.   Social History Main Topics  . Smoking status: Never Smoker  . Smokeless tobacco: Never Used  . Alcohol use 1.2 - 1.8 oz/week    2 - 3 Standard drinks or equivalent per week     Comment: occasionally  . Drug use: No  . Sexual activity: Yes    Birth control/ protection: Surgical     Comment: Hysterectomy and Husband vasectomy   Other Topics Concern  . Not on file   Social History Narrative  . No narrative on file    Family History  Problem Relation Age of Onset  . Cancer Mother     Breast  . Stroke Mother   . Hypertension Mother   . Cancer Father     bladder    BP 115/76  Pulse (!) 58   Ht 5\' 7"  (1.702 m)   Wt 185 lb (83.9 kg)   LMP 06/23/2012   BMI 28.98 kg/m   Review of Systems: See HPI above.     Objective:  Physical Exam:  Gen: NAD, comfortable in exam room  Right foot/ankle: No gross deformity, swelling, ecchymoses.  Mild pronation only. FROM ankle with 5/5 strength, no pain. TTP medial plantar calcaneus at PF insertion.  No other tenderness. Negative ant drawer and talar tilt.   Negative syndesmotic compression. Negative calcaneal squeeze. Thompsons test negative. NV intact distally.   Assessment & Plan:  1. Right plantar fasciitis - reviewed home exercises and stretches to do daily.  Arch binders, icing.  New sports insoles with scaphoid pads and heel lifts provided.  Opted for injection as well on right side.  F/u in 6  weeks or prn.  Tylenol or motrin if needed.  After informed written consent patient was seated on exam table.  Area overlying medial aspect proximal plantar fascia prepped with alcohol swab then using ultrasound guidance right plantar fascia was injected with multiple needle fenestrations with 2:1 bupivicaine: depomedrol.  Patient tolerated procedure well without immediate complications.

## 2016-08-05 NOTE — Assessment & Plan Note (Signed)
reviewed home exercises and stretches to do daily.  Arch binders, icing.  New sports insoles with scaphoid pads and heel lifts provided.  Opted for injection as well on right side.  F/u in 6 weeks or prn.  Tylenol or motrin if needed.  After informed written consent patient was seated on exam table.  Area overlying medial aspect proximal plantar fascia prepped with alcohol swab then using ultrasound guidance right plantar fascia was injected with multiple needle fenestrations with 2:1 bupivicaine: depomedrol.  Patient tolerated procedure well without immediate complications.

## 2016-08-21 ENCOUNTER — Encounter: Payer: Self-pay | Admitting: Gastroenterology

## 2016-10-04 ENCOUNTER — Encounter: Payer: Self-pay | Admitting: Gastroenterology

## 2016-10-04 ENCOUNTER — Encounter (INDEPENDENT_AMBULATORY_CARE_PROVIDER_SITE_OTHER): Payer: Self-pay

## 2016-10-04 ENCOUNTER — Ambulatory Visit (INDEPENDENT_AMBULATORY_CARE_PROVIDER_SITE_OTHER): Payer: BLUE CROSS/BLUE SHIELD | Admitting: Gastroenterology

## 2016-10-04 VITALS — BP 116/74 | HR 64 | Ht 66.0 in | Wt 196.5 lb

## 2016-10-04 DIAGNOSIS — K921 Melena: Secondary | ICD-10-CM

## 2016-10-04 DIAGNOSIS — K219 Gastro-esophageal reflux disease without esophagitis: Secondary | ICD-10-CM

## 2016-10-04 MED ORDER — NA SULFATE-K SULFATE-MG SULF 17.5-3.13-1.6 GM/177ML PO SOLN: 1.0000 | Freq: Once | ORAL | 0 refills | Status: AC

## 2016-10-04 NOTE — Patient Instructions (Signed)
If you are age 48 or older, your body mass index should be between 23-30. Your Body mass index is 31.72 kg/m. If this is out of the aforementioned range listed, please consider follow up with your Primary Care Provider.  If you are age 43 or younger, your body mass index should be between 19-25. Your Body mass index is 31.72 kg/m. If this is out of the aformentioned range listed, please consider follow up with your Primary Care Provider.   We have sent the following medications to your pharmacy for you to pick up at your convenience:  Cassandra Mccann have been scheduled for a colonoscopy. Please follow written instructions given to you at your visit today.  Please pick up your prep supplies at the pharmacy within the next 1-3 days. If you use inhalers (even only as needed), please bring them with you on the day of your procedure. Your physician has requested that you go to www.startemmi.com and enter the access code given to you at your visit today. This web site gives a general overview about your procedure. However, you should still follow specific instructions given to you by our office regarding your preparation for the procedure.  Thank you.

## 2016-10-04 NOTE — Progress Notes (Signed)
HPI :  46 y/o female with a history of DCIS R breast, GERD, HTN, HLD, here for a new patient visit for rectal bleeding and heme positive stools.   She tested positive occult blood in the stool at her primary care office per her report (no records available) although she reports she has had overt bright red blood in the stool x 1 - mostly on the toilet paper. She had some discomfort in her anal area when she saw the blood. She reports this was a one time episode and since hasn't seen it since. No constipation or diarrhea. Regular bowel habits. No abdominal pains.   No FH of known colon cancer.   She takes omeprazole 20mg  po q day. She repots she had been taking this for voice changes due to reflux which occurred at night. She reports doing okay with it in general on this regimen. No dysphagia. No FH of esophageal cancer  No prior colonoscopy or endoscopy.   Past Medical History:  Diagnosis Date  . Allergy   . 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 Endometrial hyperplasia 06/2012  . GERD (gastroesophageal reflux disease)   . Hyperlipidemia   . Hypertension   . Umbilical hernia 10/3417     Past Surgical History:  Procedure Laterality Date  . BREAST ENHANCEMENT SURGERY  2005   implants  . BREAST LUMPECTOMY Right 2011  . LAPAROSCOPIC ASSISTED VAGINAL HYSTERECTOMY N/A 07/13/2012   Procedure: LAPAROSCOPIC ASSISTED VAGINAL HYSTERECTOMY;  Surgeon: Margarette Asal, MD;  Location: [facility];  Service: Gynecology;  Laterality: N/A;  . UMBILICAL HERNIA REPAIR     Family History  Problem Relation Age of Onset  . Stroke Mother   . Hypertension Mother   . Breast cancer Mother   . Heart disease Mother   . Hyperlipidemia Mother   . Bladder Cancer Father        mets   Social History  Substance Use Topics  . Smoking status: Never Smoker  . Smokeless tobacco: Never Used  . Alcohol use 1.2 - 1.8 oz/week    2 - 3 Standard drinks or equivalent per week     Comment:  occasionally   Current Outpatient Prescriptions  Medication Sig Dispense Refill  . ALPRAZolam (XANAX) 0.25 MG tablet Take 1 tablet (0.25 mg total) by mouth daily as needed for anxiety. 30 tablet 0  . cetirizine (ZYRTEC) 10 MG tablet TAKE 1 TABLET(10 MG) BY MOUTH DAILY 30 tablet 0  . ibuprofen (ADVIL,MOTRIN) 800 MG tablet Take 1 tablet (800 mg total) by mouth every 8 (eight) hours as needed (mild pain). 30 tablet 2  . lisinopril (PRINIVIL,ZESTRIL) 10 MG tablet Take 0.5 tablets (5 mg total) by mouth daily. 45 tablet 0  . omeprazole (PRILOSEC) 20 MG capsule Take 20 mg by mouth daily as needed.     . Phentermine-Topiramate (QSYMIA PO) Take by mouth.     No current facility-administered medications for this visit.    Allergies  Allergen Reactions  . Codeine Other (See Comments)    Unknown. Can take Percocet.  Marland Kitchen Penicillins Other (See Comments)    Unknown childhood reaction  . Sulfonamide Derivatives Nausea And Vomiting     Review of Systems: All systems reviewed and negative except where noted in HPI.     Physical Exam: BP 116/74 (BP Location: Left Arm, Patient Position: Sitting, Cuff Size: Normal)   Pulse 64   Ht 5\' 6"  (1.676 m) Comment: height measured  without shoes  Wt 196 lb 8 oz (89.1 kg)   LMP 06/23/2012   BMI 31.72 kg/m  Constitutional: Pleasant,well-developed, female in no acute distress. HEENT: Normocephalic and atraumatic. Conjunctivae are normal. No scleral icterus. Neck supple.  Cardiovascular: Normal rate, regular rhythm.  Pulmonary/chest: Effort normal and breath sounds normal. No wheezing, rales or rhonchi. Abdominal: Soft, nondistended, nontender. There are no masses palpable. No hepatomegaly. Extremities: no edema Lymphadenopathy: No cervical adenopathy noted. Neurological: Alert and oriented to person place and time. Skin: Skin is warm and dry. No rashes noted. Psychiatric: Normal mood and affect. Behavior is normal.   ASSESSMENT AND PLAN:l 46 y/o female  referred for what she reports is occult positive stools, however had an overt episode of rectal bleeding in the past month. I discussed differential for rectal bleeding with her, hopefully this is just due to hemorrhoids however a colonoscopy is recommended to further evaluate, exclude bleeding polyp/mass lesion. I discussed the risk and benefits of colonoscopy with her and she wished proceed. Further recommendations pending the result.  Otherwise taking omeprazole 20 mg once a day which is working quite well for her reflux symptoms without any breakthrough or alarm symptoms. She can follow-up as needed for this issue.  [place] Cellar, MD [facility] Pager [phone]  CC: Jinny Sanders, MD

## 2016-10-18 ENCOUNTER — Encounter: Payer: Self-pay | Admitting: Gastroenterology

## 2016-10-21 ENCOUNTER — Ambulatory Visit (INDEPENDENT_AMBULATORY_CARE_PROVIDER_SITE_OTHER): Payer: BLUE CROSS/BLUE SHIELD | Admitting: Sports Medicine

## 2016-10-21 DIAGNOSIS — M25371 Other instability, right ankle: Secondary | ICD-10-CM

## 2016-10-21 DIAGNOSIS — M722 Plantar fascial fibromatosis: Secondary | ICD-10-CM | POA: Diagnosis not present

## 2016-10-21 NOTE — Patient Instructions (Signed)
Try ankle support  Ankle exercises Isometric x wall 1 foot stand eyes closed Stand and reach  ball toss off wall  Try more arch support on green sports insole  If not enough we should consider custom orthotics

## 2016-10-21 NOTE — Assessment & Plan Note (Signed)
This is contributing to the RT foot pain  Trial with ankle compression sleeve  HEP

## 2016-10-21 NOTE — Progress Notes (Signed)
CC: Chronic RT foot pain  In April saw Dr. Barbaraann Barthel for plantar fasciitis CSI helped her do a cruise I had seen her in 2011/2012 for similar problem that responded well to green sports insoles with additional padding This time still has some chronic foot pain on RT not relieved completely after insoles or the previous injection [employer] gave her an OTC orthotic That helps arch but puts pressure on outside of foot This pain is different in that it hurts at night and over more of the foot  Past HX Shoulder dislocations Several RT ankle sprains  MED HX - reviewed and documented in chart/ stable  Fam HX - daughter with hypermobility syndrome  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 Range of motion is increased on left in all directions./ On right ankle she is unstable to inversion with + drawer Strength is 5/5 in all directions. Stable  medial ligaments; squeeze test and kleiger test unremarkable; Talar dome nontender; No pain at base of 5th MT; No tenderness over cuboid; No tenderness over N spot or navicular prominence No tenderness on posterior aspects of lateral and medial malleolus No sign of peroneal tendon subluxations; Negative tarsal tunnel tinel's Able to walk 4 steps.  Foot is widened in forefoot Loss of some of longitudinal arch Only mild TTP of RT PF insertion Great to motion is good TMT bossing noted Some TMT bossing on left as well  Gait with no limp/ mild supination

## 2016-10-21 NOTE — Assessment & Plan Note (Signed)
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

## 2016-11-01 ENCOUNTER — Encounter: Payer: Self-pay | Admitting: Gastroenterology

## 2016-11-01 ENCOUNTER — Ambulatory Visit (AMBULATORY_SURGERY_CENTER): Payer: BLUE CROSS/BLUE SHIELD | Admitting: Gastroenterology

## 2016-11-01 VITALS — BP 127/86 | HR 60 | Temp 97.8°F | Resp 20 | Ht 67.0 in | Wt 190.0 lb

## 2016-11-01 DIAGNOSIS — D122 Benign neoplasm of ascending colon: Secondary | ICD-10-CM | POA: Diagnosis not present

## 2016-11-01 DIAGNOSIS — K921 Melena: Secondary | ICD-10-CM

## 2016-11-01 DIAGNOSIS — D127 Benign neoplasm of rectosigmoid junction: Secondary | ICD-10-CM

## 2016-11-01 DIAGNOSIS — K635 Polyp of colon: Secondary | ICD-10-CM

## 2016-11-01 MED ORDER — SODIUM CHLORIDE 0.9 % IV SOLN: 500.0000 mL | INTRAVENOUS | Status: DC

## 2016-11-01 NOTE — Progress Notes (Signed)
Called to room to assist during endoscopic procedure.  Patient ID and intended procedure confirmed with present staff. Received instructions for my participation in the procedure from the performing physician.  

## 2016-11-01 NOTE — Patient Instructions (Signed)
YOU HAD AN ENDOSCOPIC PROCEDURE TODAY AT THE [facility]:   Refer to the procedure report that was given to you for any specific questions about what was found during the examination.  If the procedure report does not answer your questions, please call your gastroenterologist to clarify.  If you requested that your care partner not be given the details of your procedure findings, then the procedure report has been included in a sealed envelope for you to review at your convenience later.  YOU SHOULD EXPECT: Some feelings of bloating in the abdomen. Passage of more gas than usual.  Walking can help get rid of the air that was put into your GI tract during the procedure and reduce the bloating. If you had a lower endoscopy (such as a colonoscopy or flexible sigmoidoscopy) you may notice spotting of blood in your stool or on the toilet paper. If you underwent a bowel prep for your procedure, you may not have a normal bowel movement for a few days.  Please Note:  You might notice some irritation and congestion in your nose or some drainage.  This is from the oxygen used during your procedure.  There is no need for concern and it should clear up in a day or so.  SYMPTOMS TO REPORT IMMEDIATELY:   Following lower endoscopy (colonoscopy or flexible sigmoidoscopy):  Excessive amounts of blood in the stool  Significant tenderness or worsening of abdominal pains  Swelling of the abdomen that is new, acute  Fever of 100F or higher  For urgent or emergent issues, a gastroenterologist can be reached at any hour by calling [phone].   DIET:  We do recommend a small meal at first, but then you may proceed to your regular diet.  Drink plenty of fluids but you should avoid alcoholic beverages for 24 hours.  ACTIVITY:  You should plan to take it easy for the rest of today and you should NOT DRIVE or use heavy machinery until tomorrow (because of the sedation medicines used during the test).     FOLLOW UP: Our staff will call the number listed on your records the next business day following your procedure to check on you and address any questions or concerns that you may have regarding the information given to you following your procedure. If we do not reach you, we will leave a message.  However, if you are feeling well and you are not experiencing any problems, there is no need to return our call.  We will assume that you have returned to your regular daily activities without incident.  If any biopsies were taken you will be contacted by phone or by letter within the next 1-3 weeks.  Please call us at [phone] if you have not heard about the biopsies in 3 weeks.    SIGNATURES/CONFIDENTIALITY: You and/or your care partner have signed paperwork which will be entered into your electronic medical record.  These signatures attest to the fact that that the information above on your After Visit Summary has been reviewed and is understood.  Full responsibility of the confidentiality of this discharge information lies with you and/or your care-partner  Polyp, diverticulosis, and hemorrhoid information given.. 

## 2016-11-01 NOTE — Op Note (Signed)
[facility] Patient Name: Cassandra Mccann Procedure Date: 11/01/2016 2:05 PM MRN: [id number] Endoscopist: Remo Lipps P. Eddy Liszewski MD, MD Age: 46 Referring MD:  Date of Birth: 02/08/1971 Gender: Female Account #: [id number] Procedure:                Colonoscopy Indications:              This is the patient's first colonoscopy, history of                            Hematochezia Medicines:                Monitored Anesthesia Care Procedure:                Pre-Anesthesia Assessment:                           - Prior to the procedure, a History and Physical                            was performed, and patient medications and                            allergies were reviewed. The patient's tolerance of                            previous anesthesia was also reviewed. The risks                            and benefits of the procedure and the sedation                            options and risks were discussed with the patient.                            All questions were answered, and informed consent                            was obtained. Prior Anticoagulants: The patient has                            taken no previous anticoagulant or antiplatelet                            agents. ASA Grade Assessment: II - A patient with                            mild systemic disease. After reviewing the risks                            and benefits, the patient was deemed in                            satisfactory condition to undergo the procedure.  After obtaining informed consent, the colonoscope                            was passed under direct vision. Throughout the                            procedure, the patient's blood pressure, pulse, and                            oxygen saturations were monitored continuously. The                            Model PCF-H190DL [id number]) scope was introduced                            through the anus and advanced to the the  terminal                            ileum, with identification of the appendiceal                            orifice and IC valve. The colonoscopy was performed                            without difficulty. The patient tolerated the                            procedure well. The quality of the bowel                            preparation was good. The terminal ileum, ileocecal                            valve, appendiceal orifice, and rectum were                            photographed. Scope In: 2:11:39 PM Scope Out: 2:35:56 PM Scope Withdrawal Time: 0 hours 19 minutes 59 seconds  Total Procedure Duration: 0 hours 24 minutes 17 seconds  Findings:                 The perianal and digital rectal examinations were                            normal.                           The terminal ileum appeared normal.                           A 3 mm polyp was found in the ascending colon. The                            polyp was flat. The polyp was removed with a cold  biopsy forceps. Resection and retrieval were                            complete.                           A 3 mm polyp was found in the recto-sigmoid colon.                            The polyp was sessile. The polyp was removed with a                            cold biopsy forceps. Resection and retrieval were                            complete.                           A few medium-mouthed diverticula were found in the                            sigmoid colon.                           Internal hemorrhoids were found during                            retroflexion. The hemorrhoids were small.                           The left colon was tortous. The exam was otherwise                            without abnormality. Complications:            No immediate complications. Estimated blood loss:                            Minimal. Estimated Blood Loss:     Estimated blood loss was minimal. Impression:                - The examined portion of the ileum was normal.                           - One 3 mm polyp in the ascending colon, removed                            with a cold biopsy forceps. Resected and retrieved.                           - One 3 mm polyp at the recto-sigmoid colon,                            removed with a cold biopsy forceps. Resected and  retrieved.                           - Diverticulosis in the sigmoid colon.                           - Internal hemorrhoids.                           - The examination was otherwise normal.                           Overall, suspect hemorrhoids is the likely cause of                            symptoms, no other pathology noted to cause                            bleeding. Recommendation:           - Patient has a contact number available for                            emergencies. The signs and symptoms of potential                            delayed complications were discussed with the                            patient. Return to normal activities tomorrow.                            Written discharge instructions were provided to the                            patient.                           - Resume previous diet.                           - Continue present medications.                           - Daily fiber supplement for treatment of                            hemorrhoids. Contact me for reassessment if                            bleeding persists                           - Await pathology results.                           - Repeat colonoscopy is recommended for  surveillance. The colonoscopy date will be                            determined after pathology results from today's                            exam become available for review. Remo Lipps P. Horice Carrero MD, MD 11/01/2016 2:42:05 PM This report has been signed electronically.

## 2016-11-01 NOTE — Progress Notes (Signed)
To PACU, vss patent aw report to rn 

## 2016-11-04 ENCOUNTER — Telehealth: Payer: Self-pay | Admitting: *Deleted

## 2016-11-04 NOTE — Telephone Encounter (Signed)
  Follow up Call-  Call back number 11/01/2016  Post procedure Call Back phone  # [phone]  Permission to leave phone message Yes  Some recent data might be hidden     Patient questions:  Do you have a fever, pain , or abdominal swelling? No. Pain Score  0 *  Have you tolerated food without any problems? Yes.    Have you been able to return to your normal activities? Yes.    Do you have any questions about your discharge instructions: Diet   No. Medications  No. Follow up visit  No.  Do you have questions or concerns about your Care? No.  Actions: * If pain score is 4 or above: No action needed, pain <4.

## 2016-11-11 ENCOUNTER — Encounter: Payer: Self-pay | Admitting: Gastroenterology

## 2016-11-13 DIAGNOSIS — M722 Plantar fascial fibromatosis: Secondary | ICD-10-CM | POA: Diagnosis not present

## 2016-12-04 DIAGNOSIS — M722 Plantar fascial fibromatosis: Secondary | ICD-10-CM | POA: Diagnosis not present

## 2016-12-24 DIAGNOSIS — M722 Plantar fascial fibromatosis: Secondary | ICD-10-CM | POA: Diagnosis not present

## 2016-12-26 ENCOUNTER — Encounter: Payer: BLUE CROSS/BLUE SHIELD | Admitting: Sports Medicine

## 2017-01-09 DIAGNOSIS — Z6832 Body mass index (BMI) 32.0-32.9, adult: Secondary | ICD-10-CM | POA: Diagnosis not present

## 2017-01-09 DIAGNOSIS — Z01419 Encounter for gynecological examination (general) (routine) without abnormal findings: Secondary | ICD-10-CM | POA: Diagnosis not present

## 2017-01-31 ENCOUNTER — Other Ambulatory Visit: Payer: Self-pay | Admitting: Obstetrics and Gynecology

## 2017-01-31 DIAGNOSIS — Z853 Personal history of malignant neoplasm of breast: Secondary | ICD-10-CM

## 2017-02-21 ENCOUNTER — Other Ambulatory Visit: Payer: Self-pay | Admitting: Obstetrics and Gynecology

## 2017-02-21 ENCOUNTER — Ambulatory Visit
Admission: RE | Admit: 2017-02-21 | Discharge: 2017-02-21 | Disposition: A | Discharge status: Home / Self Care | Payer: BLUE CROSS/BLUE SHIELD | Source: Ambulatory Visit | Attending: Obstetrics and Gynecology | Admitting: Obstetrics and Gynecology

## 2017-02-21 DIAGNOSIS — Z853 Personal history of malignant neoplasm of breast: Secondary | ICD-10-CM

## 2017-02-21 DIAGNOSIS — R928 Other abnormal and inconclusive findings on diagnostic imaging of breast: Secondary | ICD-10-CM | POA: Diagnosis not present

## 2017-02-21 HISTORY — DX: Personal history of irradiation: Z92.3

## 2017-05-09 DIAGNOSIS — T7589XA Other specified effects of external causes, initial encounter: Secondary | ICD-10-CM | POA: Diagnosis not present

## 2017-05-09 DIAGNOSIS — I10 Essential (primary) hypertension: Secondary | ICD-10-CM | POA: Diagnosis not present

## 2017-06-30 DIAGNOSIS — I10 Essential (primary) hypertension: Secondary | ICD-10-CM | POA: Diagnosis not present

## 2017-06-30 DIAGNOSIS — R195 Other fecal abnormalities: Secondary | ICD-10-CM | POA: Diagnosis not present

## 2017-06-30 DIAGNOSIS — R82998 Other abnormal findings in urine: Secondary | ICD-10-CM | POA: Diagnosis not present

## 2017-06-30 DIAGNOSIS — Z0000 Encounter for general adult medical examination without abnormal findings: Secondary | ICD-10-CM | POA: Diagnosis not present

## 2017-07-04 DIAGNOSIS — Z0000 Encounter for general adult medical examination without abnormal findings: Secondary | ICD-10-CM | POA: Diagnosis not present

## 2017-07-04 DIAGNOSIS — E669 Obesity, unspecified: Secondary | ICD-10-CM | POA: Diagnosis not present

## 2017-07-04 DIAGNOSIS — D649 Anemia, unspecified: Secondary | ICD-10-CM | POA: Diagnosis not present

## 2017-07-04 DIAGNOSIS — F418 Other specified anxiety disorders: Secondary | ICD-10-CM | POA: Diagnosis not present

## 2017-07-04 DIAGNOSIS — Z853 Personal history of malignant neoplasm of breast: Secondary | ICD-10-CM | POA: Diagnosis not present

## 2017-07-04 DIAGNOSIS — Z1389 Encounter for screening for other disorder: Secondary | ICD-10-CM | POA: Diagnosis not present

## 2017-08-26 DIAGNOSIS — H21562 Pupillary abnormality, left eye: Secondary | ICD-10-CM | POA: Diagnosis not present

## 2017-10-23 DIAGNOSIS — M25521 Pain in right elbow: Secondary | ICD-10-CM | POA: Diagnosis not present

## 2017-10-23 DIAGNOSIS — S63591A Other specified sprain of right wrist, initial encounter: Secondary | ICD-10-CM | POA: Diagnosis not present

## 2017-10-23 DIAGNOSIS — M79641 Pain in right hand: Secondary | ICD-10-CM | POA: Diagnosis not present

## 2017-11-20 DIAGNOSIS — M79641 Pain in right hand: Secondary | ICD-10-CM | POA: Diagnosis not present

## 2017-12-30 ENCOUNTER — Encounter (HOSPITAL_COMMUNITY): Payer: Self-pay | Admitting: Emergency Medicine

## 2017-12-30 ENCOUNTER — Emergency Department (HOSPITAL_COMMUNITY)
Admission: EM | Admit: 2017-12-30 | Discharge: 2017-12-31 | Disposition: A | Discharge status: Home / Self Care | Payer: BLUE CROSS/BLUE SHIELD | Attending: Emergency Medicine | Admitting: Emergency Medicine

## 2017-12-30 ENCOUNTER — Emergency Department (HOSPITAL_COMMUNITY): Payer: BLUE CROSS/BLUE SHIELD

## 2017-12-30 DIAGNOSIS — Z79899 Other long term (current) drug therapy: Secondary | ICD-10-CM | POA: Insufficient documentation

## 2017-12-30 DIAGNOSIS — R0789 Other chest pain: Secondary | ICD-10-CM | POA: Insufficient documentation

## 2017-12-30 DIAGNOSIS — R079 Chest pain, unspecified: Secondary | ICD-10-CM | POA: Diagnosis not present

## 2017-12-30 DIAGNOSIS — I10 Essential (primary) hypertension: Secondary | ICD-10-CM | POA: Insufficient documentation

## 2017-12-30 LAB — I-STAT TROPONIN, ED: Troponin i, poc: 0 ng/mL (ref 0.00–0.08)

## 2017-12-30 LAB — BASIC METABOLIC PANEL
Anion gap: 11 (ref 5–15)
BUN: 10 mg/dL (ref 6–20)
CHLORIDE: 103 mmol/L (ref 98–111)
CO2: 25 mmol/L (ref 22–32)
Calcium: 9.3 mg/dL (ref 8.9–10.3)
Creatinine, Ser: 0.84 mg/dL (ref 0.44–1.00)
GFR calc Af Amer: >60 mL/min (ref >60)
GFR calc non Af Amer: >60 mL/min (ref >60)
Glucose, Bld: 95 mg/dL (ref 70–99)
POTASSIUM: 4.1 mmol/L (ref 3.5–5.1)
SODIUM: 139 mmol/L (ref 135–145)

## 2017-12-30 LAB — CBC
HEMATOCRIT: 40.6 % (ref 36.0–46.0)
Hemoglobin: 13.1 g/dL (ref 12.0–15.0)
MCH: 30.4 pg (ref 26.0–34.0)
MCHC: 32.3 g/dL (ref 30.0–36.0)
MCV: 94.2 fL (ref 78.0–100.0)
Platelets: UNDETERMINED 10*3/uL (ref 150–400)
RBC: 4.31 MIL/uL (ref 3.87–5.11)
RDW: 12.4 % (ref 11.5–15.5)
WBC: 8.4 10*3/uL (ref 4.0–10.5)

## 2017-12-30 LAB — I-STAT BETA HCG BLOOD, ED (MC, WL, AP ONLY)

## 2017-12-30 NOTE — ED Notes (Signed)
Verbal to draw 2nd trop at midnight

## 2017-12-30 NOTE — ED Triage Notes (Signed)
Pt reports left sided chest pressure that woke her up on Saturday, since then it comes and goes.  "it does not feel quite right."  Pt reports she "just wants to take a deep breath."

## 2017-12-31 LAB — D-DIMER, QUANTITATIVE (NOT AT ARMC)

## 2017-12-31 LAB — I-STAT TROPONIN, ED: Troponin i, poc: 0 ng/mL (ref 0.00–0.08)

## 2017-12-31 MED ORDER — KETOROLAC TROMETHAMINE 30 MG/ML IJ SOLN
30.0000 mg | Freq: Once | INTRAMUSCULAR | Status: DC
  Filled 2017-12-31: qty 1

## 2017-12-31 MED ORDER — NAPROXEN 500 MG PO TABS: 500.0000 mg | ORAL_TABLET | Freq: Two times a day (BID) | ORAL | 0 refills | Status: DC

## 2017-12-31 NOTE — ED Notes (Signed)
Patient verbalizes understanding of discharge instructions. Opportunity for questioning and answers were provided. Armband removed by staff, pt discharged from ED ambulatory.   

## 2017-12-31 NOTE — ED Provider Notes (Signed)
[facility] EMERGENCY DEPARTMENT Provider Note   CSN: [id number] Arrival date & time: 12/30/17  1941     History   Chief Complaint Chief Complaint  Patient presents with  . Chest Pain    HPI Elinore MARRANDA ARAKELIAN is a 47 y.o. female.  HPI  This is a 47 year old female with a history of breast cancer in remission, hypertension, hyperlipidemia who presents with chest pain.  Patient reports she has had some left-sided chest discomfort since Saturday.  It comes and goes.  Does not appear associated with food, exertion, lifting, breathing.  She denies any shortness of breath or fevers.  No recent coughs.  She has not taken anything for the pain.  She states at times it makes her want to take "a big deep breath."  Denies any leg swelling, recent surgeries, recent hospitalization, recent travel.  She is not currently on any hormone replacement.  She does have acid reflux for which she takes omeprazole.  Past Medical History:  Diagnosis Date  . Allergy   . 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 Endometrial hyperplasia 06/2012  . GERD (gastroesophageal reflux disease)   . Hyperlipidemia   . Hypertension   . Personal history of radiation therapy 2011   Right Breast Cancer  . Umbilical hernia 0/1751    Patient Active Problem List   Diagnosis Date Noted  . 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 Plantar fasciitis of right foot 08/05/2016  . Viral URI with cough 02/17/2015  . Allergic rhinitis 02/17/2015  . History of umbilical hernia repair, 04/01/2013 04/21/2013  . Leiomyoma of uterus, unspecified 07/14/2012  . Anxiety 02/21/2011  . 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 09/21/2009  . HYPERCHOLESTEROLEMIA 03/06/2009  . Essential hypertension, benign 03/06/2009  . GERD 03/06/2009    Past Surgical History:  Procedure Laterality Date  . AUGMENTATION MAMMAPLASTY Right 2005  . BREAST ENHANCEMENT SURGERY  2005   implants   . BREAST LUMPECTOMY Right 2011  . LAPAROSCOPIC ASSISTED VAGINAL HYSTERECTOMY N/A 07/13/2012   Procedure: LAPAROSCOPIC ASSISTED VAGINAL HYSTERECTOMY;  Surgeon: Margarette Asal, MD;  Location: [facility];  Service: Gynecology;  Laterality: N/A;  . UMBILICAL HERNIA REPAIR       OB History   None      Home Medications    Prior to Admission medications   Medication Sig Start Date End Date Taking? Authorizing Provider  ALPRAZolam (XANAX) 0.25 MG tablet Take 1 tablet (0.25 mg total) by mouth daily as needed for anxiety. 04/21/15   Bedsole, Amy E, MD  cetirizine (ZYRTEC) 10 MG tablet TAKE 1 TABLET(10 MG) BY MOUTH DAILY 02/23/16   Jaynee Eagles, PA-C  ibuprofen (ADVIL,MOTRIN) 800 MG tablet Take 1 tablet (800 mg total) by mouth every 8 (eight) hours as needed (mild pain). 07/14/12   Molli Posey, MD  lisinopril (PRINIVIL,ZESTRIL) 10 MG tablet Take 0.5 tablets (5 mg total) by mouth daily. 12/22/15   Bedsole, Amy E, MD  naproxen (NAPROSYN) 500 MG tablet Take 1 tablet (500 mg total) by mouth 2 (two) times daily. 12/31/17   Aristide Waggle, Barbette Hair, MD  omeprazole (PRILOSEC) 20 MG capsule Take 20 mg by mouth daily as needed.  07/06/12   [provider]    Family History Family History  Problem Relation Age of Onset  . Stroke Mother   . Hypertension Mother   . Breast cancer Mother   . Heart disease Mother   . Hyperlipidemia Mother   .  Bladder Cancer Father        mets    Social History Social History   Tobacco Use  . Smoking status: Never Smoker  . Smokeless tobacco: Never Used  Substance Use Topics  . Alcohol use: Yes    Alcohol/week: 2.0 - 3.0 standard drinks    Types: 2 - 3 Standard drinks or equivalent per week    Comment: occasionally  . Drug use: No     Allergies   Codeine; Penicillins; and Sulfonamide derivatives   Review of Systems Review of Systems  Constitutional: Negative for fever.  Respiratory: Negative for cough and shortness of breath.   Cardiovascular:  Positive for chest pain. Negative for leg swelling.  Gastrointestinal: Negative for abdominal pain, nausea and vomiting.  Genitourinary: Negative for dysuria.  All other systems reviewed and are negative.    Physical Exam Updated Vital Signs BP 128/72 (BP Location: Left Arm)   Pulse 75   Temp 98.9 F (37.2 C) (Oral)   Resp 18   Ht 1.702 m (5\' 7" )   Wt 88.5 kg   LMP 06/23/2012   SpO2 98%   BMI 30.54 kg/m   Physical Exam  Constitutional: She is oriented to person, place, and time. She appears well-developed and well-nourished.  HENT:  Head: Normocephalic and atraumatic.  Eyes: Pupils are equal, round, and reactive to light.  Neck: Neck supple.  Cardiovascular: Normal rate, regular rhythm, normal heart sounds and normal pulses.  No reproducible tenderness to palpation  Pulmonary/Chest: Effort normal. No respiratory distress. She has no wheezes.  Abdominal: Soft. Bowel sounds are normal.  Musculoskeletal:       Right lower leg: She exhibits no tenderness and no edema.       Left lower leg: She exhibits no tenderness and no edema.  Neurological: She is alert and oriented to person, place, and time.  Skin: Skin is warm and dry.  Psychiatric: She has a normal mood and affect.  Nursing note and vitals reviewed.    ED Treatments / Results  Labs (all labs ordered are listed, but only abnormal results are displayed) Labs Reviewed  BASIC METABOLIC PANEL  CBC  D-DIMER, QUANTITATIVE (NOT AT [facility])  I-STAT TROPONIN, ED  I-STAT BETA HCG BLOOD, ED ([facility], [facility], [facility] ONLY)  I-STAT TROPONIN, ED    EKG EKG Interpretation  Date/Time:  Tuesday December 30 2017 19:45:56 EDT Ventricular Rate:  82 PR Interval:  142 QRS Duration: 84 QT Interval:  370 QTC Calculation: 432 R Axis:   1 Text Interpretation:  Normal sinus rhythm Cannot rule out Anterior infarct , age undetermined T wave abnormality Abnormal ekg Confirmed by Carmin Muskrat [phone]) on 12/30/2017 9:22:11 PM   Radiology Dg  Chest 2 View  Result Date: 12/30/2017 CLINICAL DATA:  Chest pain EXAM: CHEST - 2 VIEW COMPARISON:  Chest CT 02/22/2011 FINDINGS: The heart size and mediastinal contours are within normal limits. Both lungs are clear. The visualized skeletal structures are unremarkable. IMPRESSION: Clear lungs. Electronically Signed   By: Ulyses Jarred M.D.   On: 12/30/2017 20:47    Procedures Procedures (including critical care time)  Medications Ordered in ED Medications  ketorolac (TORADOL) 30 MG/ML injection 30 mg (30 mg Intramuscular Refused 12/31/17 0202)     Initial Impression / Assessment and Plan / ED Course  I have reviewed the triage vital signs and the nursing notes.  Pertinent labs & imaging results that were available during my care of the patient were reviewed by me and considered  in my medical decision making (see chart for details).     Patient presents with chest pain.  Ongoing and waxing waning over the last several days.  She is overall nontoxic-appearing.  She does have some risk factors for ACS including hypertension and hyperlipidemia.  EKG shows no acute ischemia.  Chest x-ray shows no evidence of pneumothorax or pneumonia.  Troponin x2 are negative.  She has no active cancer and no significant risk factors for PE.  Screening d-dimer was sent and is negative.  This effectively rules her out.  Considerations include costochondritis.  She does have a history of reflux but states that this feels different.  She should continue her omeprazole.  Will trial naproxen twice daily and have her follow-up closely with her primary physician.  After history, exam, and medical workup I feel the patient has been appropriately medically screened and is safe for discharge home. Pertinent diagnoses were discussed with the patient. Patient was given return precautions.   Final Clinical Impressions(s) / ED Diagnoses   Final diagnoses:  Atypical chest pain    ED Discharge Orders         Ordered     naproxen (NAPROSYN) 500 MG tablet  2 times daily     12/31/17 0253           Yaiza Palazzola, Barbette Hair, MD 12/31/17 0300

## 2017-12-31 NOTE — Discharge Instructions (Addendum)
You were seen today for chest pain.  Your work-up was reassuring including screening test for blood clots.  Trial naproxen to see if this helps with your discomfort.  Follow-up closely with your primary doctor.

## 2018-03-06 ENCOUNTER — Other Ambulatory Visit: Payer: Self-pay | Admitting: Obstetrics and Gynecology

## 2018-03-06 DIAGNOSIS — Z853 Personal history of malignant neoplasm of breast: Secondary | ICD-10-CM

## 2018-03-20 ENCOUNTER — Ambulatory Visit
Admission: RE | Admit: 2018-03-20 | Discharge: 2018-03-20 | Disposition: A | Discharge status: Home / Self Care | Payer: BLUE CROSS/BLUE SHIELD | Source: Ambulatory Visit | Attending: Obstetrics and Gynecology | Admitting: Obstetrics and Gynecology

## 2018-03-20 ENCOUNTER — Other Ambulatory Visit: Payer: Self-pay | Admitting: Obstetrics and Gynecology

## 2018-03-20 DIAGNOSIS — Z853 Personal history of malignant neoplasm of breast: Secondary | ICD-10-CM

## 2018-03-20 DIAGNOSIS — N6489 Other specified disorders of breast: Secondary | ICD-10-CM

## 2018-03-20 DIAGNOSIS — N6320 Unspecified lump in the left breast, unspecified quadrant: Secondary | ICD-10-CM

## 2018-03-20 DIAGNOSIS — R920 Mammographic microcalcification found on diagnostic imaging of breast: Secondary | ICD-10-CM | POA: Diagnosis not present

## 2018-03-25 ENCOUNTER — Ambulatory Visit
Admission: RE | Admit: 2018-03-25 | Discharge: 2018-03-25 | Disposition: A | Discharge status: Home / Self Care | Payer: BLUE CROSS/BLUE SHIELD | Source: Ambulatory Visit | Attending: Obstetrics and Gynecology | Admitting: Obstetrics and Gynecology

## 2018-03-25 ENCOUNTER — Other Ambulatory Visit: Payer: Self-pay | Admitting: Obstetrics and Gynecology

## 2018-03-25 DIAGNOSIS — N6320 Unspecified lump in the left breast, unspecified quadrant: Secondary | ICD-10-CM

## 2018-03-25 DIAGNOSIS — N62 Hypertrophy of breast: Secondary | ICD-10-CM | POA: Diagnosis not present

## 2018-03-25 DIAGNOSIS — R921 Mammographic calcification found on diagnostic imaging of breast: Secondary | ICD-10-CM | POA: Diagnosis not present

## 2018-04-10 ENCOUNTER — Other Ambulatory Visit: Payer: Self-pay | Admitting: Surgery

## 2018-04-10 DIAGNOSIS — Z853 Personal history of malignant neoplasm of breast: Secondary | ICD-10-CM | POA: Diagnosis not present

## 2018-04-10 DIAGNOSIS — N6320 Unspecified lump in the left breast, unspecified quadrant: Secondary | ICD-10-CM | POA: Diagnosis not present

## 2018-04-13 ENCOUNTER — Encounter (HOSPITAL_BASED_OUTPATIENT_CLINIC_OR_DEPARTMENT_OTHER): Payer: Self-pay | Admitting: *Deleted

## 2018-04-13 ENCOUNTER — Other Ambulatory Visit: Payer: Self-pay

## 2018-04-17 ENCOUNTER — Ambulatory Visit
Admission: RE | Admit: 2018-04-17 | Discharge: 2018-04-17 | Disposition: A | Discharge status: Home / Self Care | Payer: BLUE CROSS/BLUE SHIELD | Source: Ambulatory Visit | Attending: Surgery | Admitting: Surgery

## 2018-04-17 DIAGNOSIS — N6320 Unspecified lump in the left breast, unspecified quadrant: Secondary | ICD-10-CM

## 2018-04-17 DIAGNOSIS — R928 Other abnormal and inconclusive findings on diagnostic imaging of breast: Secondary | ICD-10-CM | POA: Diagnosis not present

## 2018-04-17 NOTE — Progress Notes (Signed)
Pt in to pick up Ensure pre op drink, Instructions reviewed.

## 2018-04-19 NOTE — H&P (Signed)
Cassandra Mccann  Location: [facility] Office Patient #: [id number] DOB: 01-Jun-1970 Married / Language: English / Race: White Female  History of Present Illness   The patient is a 47 year old female who presents with a complaint of breast abnormality.  The PCP is Dr. Aviva Signs.  The patient was referred by Dr. Thomas Hoff.  She comes with her husband, Dwayne. He did not say much.   I last saw her on 04/21/2013 for follow up of right breast DCIS. She has done well since I last saw her. She has returned for routine mammography. She did not feel anything in her left breast. She noticed some decreased size of her right breast after the radiation therapy. She had a mammogram on 03/20/2018 which showed 1. Indeterminate 4 mm mass associated with possible architectural distortion and containing microcalcifications in the UPPER INNER QUADRANT of the LEFT breast at MIDDLE to POSTERIOR depth without sonographic correlate. 2. No pathologic LEFT axillary lymphadenopathy. 3. Expected post lumpectomy changes involving the RIGHT breast. No mammographic evidence of malignancy involving the RIGHT breast.  She had a left breast biopsy on 03/25/2018 ([id number]) which showed a complex sclerosing lesion. I gave her a copy of the path report. I discussed the risk of surgery, which include bleeding, infection, nerve injury, and injury to her implants. I talked also the possibility for breast cancer which could require more surgery.  Plan: 1) Excise the left breast mass with seed localization. We'll try to get this surgery on during this year.  Review of Systems as stated in this history (HPI) or in the review of systems. Otherwise all other 12 point ROS are negative  Past Medical History: 1. DCIS right breast. 10 o'clock. (Tis, N0) Lumpectomy - 10/09/2009. ER - 99%, PR - 98% Sees Dr. Jana Hakim Truddie Coco) and Dr. Melina Modena. On tamoxifen. Doing well  without symptoms. Disease free.  I will see her in one year and I will alternate q 6 months with Dr. Jana Hakim. So her follow up with me is 1 year.  2. Bilateral implants, in [place] - 2005. 3. Hypertension. On Lisinopril. 4. Repaired umbilical hernia.- 04/01/2013 - at [facility] - D. Jevaeh Shams 5. Colonoscopy - 11/13/2017 - Dr. Havery Moros 6. Lap assisted vag hysterectomy - 07/13/2012  Social History: Married. Her husband, Karma Greaser, is with her. She has 2 children - 17 yo sone works for [employer] and 47 yo daughter in nursing school. She works as a Copywriter, advertising  Past Surgical History (April Staton, [place]; 04/10/2018 1:58 PM) Breast Augmentation  Bilateral. Breast Biopsy  Bilateral. Breast Mass; Local Excision  Right. Hysterectomy (not due to cancer) - Partial  Ventral / Umbilical Hernia Surgery  Bilateral.  Diagnostic Studies History (April Staton, CMA; 04/10/2018 1:58 PM) Colonoscopy  within last year Mammogram  within last year Pap Smear  1-5 years ago  Allergies (April Staton, [place]; 04/10/2018 2:01 PM) Codeine and Related  Penicillins  Sulfa Antibiotics  Allergies Reconciled   Medication History (April Staton, CMA; 04/10/2018 2:02 PM) ALPRAZolam (0.25MG  Tablet, Oral) Active. ZyrTEC Allergy (10MG  Tablet, Oral) Active. Lisinopril (10MG  Tablet, Oral) Active. Medications Reconciled  Social History (April Staton, CMA; 04/10/2018 1:58 PM) Alcohol use  Occasional alcohol use. Caffeine use  Carbonated beverages, Coffee. No drug use  Tobacco use  Never smoker.  Family History (April Staton, [place]; 04/10/2018 1:58 PM) Arthritis  Mother. Breast Cancer  Mother. Cancer  Father. Cerebrovascular Accident  Mother. Heart Disease  Mother. Heart disease in female family member before age 21  Hypertension  Mother. Melanoma  Mother. Migraine Headache  Mother.  Pregnancy / Birth History (April Staton, [place]; 04/10/2018 1:58 PM) Age at  menarche  72 years. Contraceptive History  Oral contraceptives. Gravida  3 Maternal age  51-30 Para  2  Other Problems (April Staton, [place]; 04/10/2018 1:58 PM) Breast Cancer  Gastroesophageal Reflux Disease  High blood pressure  Lump In Breast     Review of Systems (April Staton CMA; 04/10/2018 1:58 PM) General Not Present- Appetite Loss, Chills, Fatigue, Fever, Night Sweats, Weight Gain and Weight Loss. Skin Not Present- Change in Wart/Mole, Dryness, Hives, Jaundice, New Lesions, Non-Healing Wounds, Rash and Ulcer. HEENT Present- Seasonal Allergies and Wears glasses/contact lenses. Not Present- Earache, Hearing Loss, Hoarseness, Nose Bleed, Oral Ulcers, Ringing in the Ears, Sinus Pain, Sore Throat, Visual Disturbances and Yellow Eyes. Breast Present- Breast Mass. Not Present- Breast Pain, Nipple Discharge and Skin Changes. Cardiovascular Not Present- Chest Pain, Difficulty Breathing Lying Down, Leg Cramps, Palpitations, Rapid Heart Rate, Shortness of Breath and Swelling of Extremities. Gastrointestinal Not Present- Abdominal Pain, Bloating, Bloody Stool, Change in Bowel Habits, Chronic diarrhea, Constipation, Difficulty Swallowing, Excessive gas, Gets full quickly at meals, Hemorrhoids, Indigestion, Nausea, Rectal Pain and Vomiting. Female Genitourinary Not Present- Frequency, Nocturia, Painful Urination, Pelvic Pain and Urgency. Musculoskeletal Not Present- Back Pain, Joint Pain, Joint Stiffness, Muscle Pain, Muscle Weakness and Swelling of Extremities. Neurological Not Present- Decreased Memory, Fainting, Headaches, Numbness, Seizures, Tingling, Tremor, Trouble walking and Weakness. Endocrine Not Present- Cold Intolerance, Excessive Hunger, Hair Changes, Heat Intolerance, Hot flashes and New Diabetes. Hematology Not Present- Blood Thinners, Easy Bruising, Excessive bleeding, Gland problems, HIV and Persistent Infections.  Vitals (April Staton CMA; 04/10/2018 2:02 PM) 04/10/2018  2:02 PM Weight: 186.13 lb Height: 66.5in Body Surface Area: 1.95 m Body Mass Index: 29.59 kg/m  Temp.: 97.10F(Oral)  Pulse: 63 (Regular)  BP: 120/74 (Sitting, Left Arm, Standard)   Physical Exam  General: WN WF who isalert and generally healthy appearing. HEENT: Normal. Pupils equal.  Neck: Supple. No mass. No thyroid mass.  Lymph Nodes: No supraclavicular, cervical, or axillary nodes.  Lungs: Clear to auscultation and symmetric breath sounds. Heart: RRR. No murmur or rub.  Breasts: Right - Large (but part of this is implant). She has a scar at the 10 o'clock posiitonn of the right breast which is okay. Left - Large (but part of this is implant). She has some steristips over a puncture wound at the 10 o'clock position about 4 cm off the areola.  Abdomen: Soft. No mass. No tenderness. No hernia. Normal bowel sounds.  Rectal: Not done.  Extremities: Good strength and ROM in upper and lower extremities.  Neurologic: Grossly intact to motor and sensory function. Psychiatric: Has normal mood and affect. Behavior is normal.   Assessment & Plan  1.  LEFT BREAST MASS (N63.20)  Story: Left breast biopsy on 03/25/2018 ([id number]) which showed a complex sclerosing lesion.  Plan:  1) Left breast lumptectomy (seed localization)  2.  HISTORY OF RIGHT BREAST CANCER (Z85.3)  Story: DCIS right breast. 10 o'clock. (Tis, N0), 0.4 cm  Lumpectomy - 10/09/2009.  ER - 99%, PR - 98%  Saw Dr. Jana Hakim Truddie Coco) and Dr. Melina Modena (had rad tx)  Took tamoxifen x 5 years  3. Bilateral implants, in [facility] - 2005. 4. Hypertension. On Lisinopril. 5. Repaired umbilical hernia.- 04/01/2013 - at [facility] - D. Cassie Shedlock 6. Lap assisted vag hysterectomy - 07/13/2012   Alphonsa Overall, MD, [facility] Pager: [phone] Office  phone:  [phone]

## 2018-04-20 ENCOUNTER — Ambulatory Visit
Admission: RE | Admit: 2018-04-20 | Discharge: 2018-04-20 | Disposition: A | Discharge status: Home / Self Care | Payer: BLUE CROSS/BLUE SHIELD | Source: Ambulatory Visit | Attending: Surgery | Admitting: Surgery

## 2018-04-20 ENCOUNTER — Other Ambulatory Visit: Payer: Self-pay

## 2018-04-20 ENCOUNTER — Ambulatory Visit (HOSPITAL_BASED_OUTPATIENT_CLINIC_OR_DEPARTMENT_OTHER): Payer: BLUE CROSS/BLUE SHIELD | Admitting: Anesthesiology

## 2018-04-20 ENCOUNTER — Encounter (HOSPITAL_BASED_OUTPATIENT_CLINIC_OR_DEPARTMENT_OTHER): Payer: Self-pay | Admitting: Emergency Medicine

## 2018-04-20 ENCOUNTER — Encounter (HOSPITAL_BASED_OUTPATIENT_CLINIC_OR_DEPARTMENT_OTHER)
Admission: RE | Disposition: A | Discharge status: Home / Self Care | Payer: Self-pay | Source: Home / Self Care | Attending: Surgery

## 2018-04-20 ENCOUNTER — Ambulatory Visit (HOSPITAL_BASED_OUTPATIENT_CLINIC_OR_DEPARTMENT_OTHER)
Admission: RE | Admit: 2018-04-20 | Discharge: 2018-04-20 | Disposition: A | Discharge status: Home / Self Care | Payer: BLUE CROSS/BLUE SHIELD | Attending: Surgery | Admitting: Surgery

## 2018-04-20 DIAGNOSIS — N6320 Unspecified lump in the left breast, unspecified quadrant: Secondary | ICD-10-CM

## 2018-04-20 DIAGNOSIS — I10 Essential (primary) hypertension: Secondary | ICD-10-CM | POA: Diagnosis not present

## 2018-04-20 DIAGNOSIS — N6012 Diffuse cystic mastopathy of left breast: Secondary | ICD-10-CM | POA: Diagnosis not present

## 2018-04-20 DIAGNOSIS — Z79899 Other long term (current) drug therapy: Secondary | ICD-10-CM | POA: Diagnosis not present

## 2018-04-20 DIAGNOSIS — K219 Gastro-esophageal reflux disease without esophagitis: Secondary | ICD-10-CM | POA: Insufficient documentation

## 2018-04-20 DIAGNOSIS — N6022 Fibroadenosis of left breast: Secondary | ICD-10-CM | POA: Insufficient documentation

## 2018-04-20 DIAGNOSIS — E7800 Pure hypercholesterolemia, unspecified: Secondary | ICD-10-CM | POA: Diagnosis not present

## 2018-04-20 DIAGNOSIS — N6489 Other specified disorders of breast: Secondary | ICD-10-CM | POA: Diagnosis not present

## 2018-04-20 HISTORY — PX: BREAST LUMPECTOMY WITH RADIOACTIVE SEED LOCALIZATION: SHX6424

## 2018-04-20 HISTORY — DX: Other complications of anesthesia, initial encounter: T88.59XA

## 2018-04-20 HISTORY — DX: Other specified postprocedural states: Z98.890

## 2018-04-20 HISTORY — DX: Adverse effect of unspecified anesthetic, initial encounter: T41.45XA

## 2018-04-20 HISTORY — DX: Anxiety disorder, unspecified: F41.9

## 2018-04-20 HISTORY — DX: Nausea with vomiting, unspecified: R11.2

## 2018-04-20 SURGERY — BREAST LUMPECTOMY WITH RADIOACTIVE SEED LOCALIZATION
Anesthesia: General | Site: Breast | Laterality: Left

## 2018-04-20 MED ORDER — BUPIVACAINE-EPINEPHRINE (PF) 0.25% -1:200000 IJ SOLN
INTRAMUSCULAR | Status: AC
  Filled 2018-04-20: qty 30

## 2018-04-20 MED ORDER — KETOROLAC TROMETHAMINE 30 MG/ML IJ SOLN
INTRAMUSCULAR | Status: AC
  Filled 2018-04-20: qty 1

## 2018-04-20 MED ORDER — GABAPENTIN 300 MG PO CAPS
ORAL_CAPSULE | ORAL | Status: AC
  Filled 2018-04-20: qty 1

## 2018-04-20 MED ORDER — LIDOCAINE 2% (20 MG/ML) 5 ML SYRINGE
INTRAMUSCULAR | Status: AC
  Filled 2018-04-20: qty 5

## 2018-04-20 MED ORDER — DEXAMETHASONE SODIUM PHOSPHATE 10 MG/ML IJ SOLN
INTRAMUSCULAR | Status: DC | PRN
  Administered 2018-04-20: 10 mg via INTRAVENOUS

## 2018-04-20 MED ORDER — KETOROLAC TROMETHAMINE 30 MG/ML IJ SOLN
INTRAMUSCULAR | Status: DC | PRN
  Administered 2018-04-20: 30 mg via INTRAVENOUS

## 2018-04-20 MED ORDER — CEFAZOLIN SODIUM-DEXTROSE 2-4 GM/100ML-% IV SOLN
INTRAVENOUS | Status: AC
  Filled 2018-04-20: qty 100

## 2018-04-20 MED ORDER — FENTANYL CITRATE (PF) 100 MCG/2ML IJ SOLN
INTRAMUSCULAR | Status: AC
  Filled 2018-04-20: qty 2

## 2018-04-20 MED ORDER — MIDAZOLAM HCL 2 MG/2ML IJ SOLN: 1.0000 mg | INTRAMUSCULAR | Status: DC | PRN

## 2018-04-20 MED ORDER — ACETAMINOPHEN 500 MG PO TABS
1000.0000 mg | ORAL_TABLET | ORAL | Status: AC
  Administered 2018-04-20: 1000 mg via ORAL

## 2018-04-20 MED ORDER — ACETAMINOPHEN 500 MG PO TABS
ORAL_TABLET | ORAL | Status: AC
  Filled 2018-04-20: qty 2

## 2018-04-20 MED ORDER — HYDROCODONE-ACETAMINOPHEN 5-325 MG PO TABS: 1.0000 | ORAL_TABLET | Freq: Four times a day (QID) | ORAL | 0 refills | Status: DC | PRN

## 2018-04-20 MED ORDER — FENTANYL CITRATE (PF) 100 MCG/2ML IJ SOLN: 50.0000 ug | INTRAMUSCULAR | Status: DC | PRN

## 2018-04-20 MED ORDER — PROPOFOL 10 MG/ML IV BOLUS
INTRAVENOUS | Status: DC | PRN
  Administered 2018-04-20: 200 mg via INTRAVENOUS

## 2018-04-20 MED ORDER — CEFAZOLIN SODIUM-DEXTROSE 2-3 GM-%(50ML) IV SOLR
INTRAVENOUS | Status: DC | PRN
  Administered 2018-04-20: 2 g via INTRAVENOUS

## 2018-04-20 MED ORDER — FENTANYL CITRATE (PF) 100 MCG/2ML IJ SOLN: 25.0000 ug | INTRAMUSCULAR | Status: DC | PRN

## 2018-04-20 MED ORDER — CEFAZOLIN SODIUM-DEXTROSE 2-4 GM/100ML-% IV SOLN: 2.0000 g | INTRAVENOUS | Status: DC

## 2018-04-20 MED ORDER — FENTANYL CITRATE (PF) 100 MCG/2ML IJ SOLN
INTRAMUSCULAR | Status: DC | PRN
  Administered 2018-04-20 (×2): 50 ug via INTRAVENOUS

## 2018-04-20 MED ORDER — LACTATED RINGERS IV SOLN
INTRAVENOUS | Status: DC
  Administered 2018-04-20: 08:00:00 via INTRAVENOUS

## 2018-04-20 MED ORDER — LIDOCAINE HCL (PF) 1 % IJ SOLN
INTRAMUSCULAR | Status: AC
  Filled 2018-04-20: qty 30

## 2018-04-20 MED ORDER — LIDOCAINE 2% (20 MG/ML) 5 ML SYRINGE
INTRAMUSCULAR | Status: AC
  Filled 2018-04-20: qty 10

## 2018-04-20 MED ORDER — OXYCODONE HCL 5 MG PO TABS: 5.0000 mg | ORAL_TABLET | Freq: Once | ORAL | Status: DC | PRN

## 2018-04-20 MED ORDER — LIDOCAINE 2% (20 MG/ML) 5 ML SYRINGE
INTRAMUSCULAR | Status: DC | PRN
  Administered 2018-04-20: 20 mg via INTRAVENOUS

## 2018-04-20 MED ORDER — PROMETHAZINE HCL 25 MG/ML IJ SOLN: 6.2500 mg | INTRAMUSCULAR | Status: DC | PRN

## 2018-04-20 MED ORDER — MIDAZOLAM HCL 2 MG/2ML IJ SOLN
INTRAMUSCULAR | Status: AC
  Filled 2018-04-20: qty 2

## 2018-04-20 MED ORDER — GABAPENTIN 300 MG PO CAPS
300.0000 mg | ORAL_CAPSULE | ORAL | Status: AC
  Administered 2018-04-20: 300 mg via ORAL

## 2018-04-20 MED ORDER — BUPIVACAINE-EPINEPHRINE 0.25% -1:200000 IJ SOLN
INTRAMUSCULAR | Status: DC | PRN
  Administered 2018-04-20: 30 mL

## 2018-04-20 MED ORDER — PROPOFOL 10 MG/ML IV BOLUS
INTRAVENOUS | Status: AC
  Filled 2018-04-20: qty 20

## 2018-04-20 MED ORDER — CHLORHEXIDINE GLUCONATE CLOTH 2 % EX PADS: 6.0000 | MEDICATED_PAD | Freq: Once | CUTANEOUS | Status: DC

## 2018-04-20 MED ORDER — ONDANSETRON HCL 4 MG/2ML IJ SOLN
INTRAMUSCULAR | Status: DC | PRN
  Administered 2018-04-20 (×2): 4 mg via INTRAVENOUS

## 2018-04-20 MED ORDER — DEXAMETHASONE SODIUM PHOSPHATE 10 MG/ML IJ SOLN
INTRAMUSCULAR | Status: AC
  Filled 2018-04-20: qty 1

## 2018-04-20 MED ORDER — ONDANSETRON HCL 4 MG/2ML IJ SOLN
INTRAMUSCULAR | Status: AC
  Filled 2018-04-20: qty 4

## 2018-04-20 MED ORDER — MIDAZOLAM HCL 5 MG/5ML IJ SOLN
INTRAMUSCULAR | Status: DC | PRN
  Administered 2018-04-20: 2 mg via INTRAVENOUS

## 2018-04-20 MED ORDER — OXYCODONE HCL 5 MG/5ML PO SOLN: 5.0000 mg | Freq: Once | ORAL | Status: DC | PRN

## 2018-04-20 MED ORDER — BUPIVACAINE HCL (PF) 0.25 % IJ SOLN
INTRAMUSCULAR | Status: AC
  Filled 2018-04-20: qty 30

## 2018-04-20 MED ORDER — BUPIVACAINE HCL (PF) 0.5 % IJ SOLN
INTRAMUSCULAR | Status: AC
  Filled 2018-04-20: qty 30

## 2018-04-20 MED ORDER — SCOPOLAMINE 1 MG/3DAYS TD PT72
1.0000 | MEDICATED_PATCH | Freq: Once | TRANSDERMAL | Status: DC | PRN
  Administered 2018-04-20: 1.5 mg via TRANSDERMAL

## 2018-04-20 SURGICAL SUPPLY — 52 items
ADH SKN CLS APL DERMABOND .7 (GAUZE/BANDAGES/DRESSINGS) ×1
APL SKNCLS STERI-STRIP NONHPOA (GAUZE/BANDAGES/DRESSINGS)
BENZOIN TINCTURE PRP APPL 2/3 (GAUZE/BANDAGES/DRESSINGS) IMPLANT
BINDER BREAST LRG (GAUZE/BANDAGES/DRESSINGS) IMPLANT
BINDER BREAST MEDIUM (GAUZE/BANDAGES/DRESSINGS) IMPLANT
BINDER BREAST XLRG (GAUZE/BANDAGES/DRESSINGS) ×1 IMPLANT
BINDER BREAST XXLRG (GAUZE/BANDAGES/DRESSINGS) IMPLANT
BLADE HEX COATED 2.75 (ELECTRODE) IMPLANT
BLADE SURG 10 STRL SS (BLADE) ×2 IMPLANT
BLADE SURG 15 STRL LF DISP TIS (BLADE) ×1 IMPLANT
BLADE SURG 15 STRL SS (BLADE) ×2
CANISTER SUC SOCK COL 7IN (MISCELLANEOUS) IMPLANT
CANISTER SUCT 1200ML W/VALVE (MISCELLANEOUS) ×2 IMPLANT
CHLORAPREP W/TINT 26ML (MISCELLANEOUS) ×2 IMPLANT
CLIP VESOCCLUDE SM WIDE 6/CT (CLIP) IMPLANT
COVER BACK TABLE 60X90IN (DRAPES) ×2 IMPLANT
COVER MAYO STAND STRL (DRAPES) ×2 IMPLANT
COVER PROBE W GEL 5X96 (DRAPES) ×2 IMPLANT
COVER WAND RF STERILE (DRAPES) IMPLANT
DECANTER SPIKE VIAL GLASS SM (MISCELLANEOUS) ×1 IMPLANT
DERMABOND ADVANCED (GAUZE/BANDAGES/DRESSINGS) ×1
DERMABOND ADVANCED .7 DNX12 (GAUZE/BANDAGES/DRESSINGS) ×1 IMPLANT
DRAPE LAPAROTOMY 100X72 PEDS (DRAPES) ×2 IMPLANT
DRAPE UTILITY XL STRL (DRAPES) ×2 IMPLANT
DRSG PAD ABDOMINAL 8X10 ST (GAUZE/BANDAGES/DRESSINGS) ×1 IMPLANT
ELECT COATED BLADE 2.86 ST (ELECTRODE) ×2 IMPLANT
ELECT REM PT RETURN 9FT ADLT (ELECTROSURGICAL) ×2
ELECTRODE REM PT RTRN 9FT ADLT (ELECTROSURGICAL) ×1 IMPLANT
GAUZE SPONGE 4X4 12PLY STRL LF (GAUZE/BANDAGES/DRESSINGS) IMPLANT
GLOVE SURG SIGNA 7.5 PF LTX (GLOVE) ×2 IMPLANT
GOWN STRL REUS W/ TWL LRG LVL3 (GOWN DISPOSABLE) ×1 IMPLANT
GOWN STRL REUS W/ TWL XL LVL3 (GOWN DISPOSABLE) ×1 IMPLANT
GOWN STRL REUS W/TWL LRG LVL3 (GOWN DISPOSABLE) ×2
GOWN STRL REUS W/TWL XL LVL3 (GOWN DISPOSABLE) ×2
KIT MARKER MARGIN INK (KITS) ×2 IMPLANT
NDL HYPO 25X1 1.5 SAFETY (NEEDLE) ×1 IMPLANT
NEEDLE HYPO 25X1 1.5 SAFETY (NEEDLE) ×2 IMPLANT
NS IRRIG 1000ML POUR BTL (IV SOLUTION) IMPLANT
PACK BASIN DAY SURGERY FS (CUSTOM PROCEDURE TRAY) ×2 IMPLANT
PENCIL BUTTON HOLSTER BLD 10FT (ELECTRODE) ×2 IMPLANT
SHEET MEDIUM DRAPE 40X70 STRL (DRAPES) ×1 IMPLANT
SLEEVE SCD COMPRESS KNEE MED (MISCELLANEOUS) ×2 IMPLANT
SPONGE LAP 18X18 RF (DISPOSABLE) ×2 IMPLANT
STRIP CLOSURE SKIN 1/4X4 (GAUZE/BANDAGES/DRESSINGS) IMPLANT
SUT MNCRL AB 4-0 PS2 18 (SUTURE) ×2 IMPLANT
SUT VICRYL 3-0 CR8 SH (SUTURE) ×2 IMPLANT
SYR CONTROL 10ML LL (SYRINGE) ×2 IMPLANT
TOWEL GREEN STERILE FF (TOWEL DISPOSABLE) ×2 IMPLANT
TOWEL OR NON WOVEN STRL DISP B (DISPOSABLE) ×2 IMPLANT
TRAY FAXITRON CT DISP (TRAY / TRAY PROCEDURE) ×1 IMPLANT
TUBE CONNECTING 20X1/4 (TUBING) ×2 IMPLANT
YANKAUER SUCT BULB TIP NO VENT (SUCTIONS) ×2 IMPLANT

## 2018-04-20 NOTE — Op Note (Signed)
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  ANESTHESIA:   general  Anesthesiologist: Suzette Battiest, MD CRNA: Gwyndolyn Saxon, CRNA  General  EBL:  minimal  ml  DRAINS:  none   LOCAL MEDICATIONS USED:   30 cc of 1/4% marcaine  SPECIMEN:   Breast lump (6 color paint)  COUNTS CORRECT:  YES  INDICATIONS FOR PROCEDURE:  Cassandra Mccann is a 47 y.o. (DOB: [birth year]) white female whose primary care physician is Shon Baton, MD and comes for left breast lumpectomy.   She had a left breast biopsy on 03/25/2018 that shows a sclerosing lesion. She now comes for excision of this area of her breast.    The indications and potential complications of surgery were explained to the patient. Potential complications include, but are not limited to, bleeding, infection, the need for further surgery, and nerve injury.     She had a I131 seed placed on 03/18/2018 in her left breast at [facility].  The seed is in the 10 o'clock position of the left breast.     OPERATIVE NOTE:   The patient was taken to operating room # 2 at [facility] where she underwent a general anesthesia  supervised by Anesthesiologist: Suzette Battiest, MD CRNA: Gwyndolyn Saxon, CRNA. Her left breast and axilla were prepped with  ChloraPrep and sterilely draped.    A time-out and the surgical check list was reviewed.    The lesion was about at the 10 o'clock position of the left breast.   I used the Neoprobe to identify the I131 seed.  I made an upper inner circumareolar incision in the left breast.   I tried to excise an area around the tumor of at least 1 cm.    I excised this block of breast tissue approximately 3 cm by  3 cm  in diameter.   I painted the lumpectomy specimen with the 6 color paint kit and did a specimen mammogram which confirmed the mass, clip, and the seed were all in the right position in the specimen.  The specimen was sent to pathology who called back to confirm that they have the seed and the specimen.   I then irrigated the wound with saline. I infiltrated approximately 30 mL of 1/4% Marcaine between the incision.   I then closed the wound in layers using 3-0 Vicryl sutures for the deep layer. At the skin, I closed the incision with a 4-0 Monocryl suture. The incision was then painted with Dermabond.  She had gauze place over the wounds and placed in a breast binder.   The patient tolerated the procedure well, was transported to the recovery room in good condition. Sponge and needle count were correct at the end of the case.   Final pathology is pending.   Alphonsa Overall, MD, [facility] Pager: [phone] Office phone:  [phone]

## 2018-04-20 NOTE — Interval H&P Note (Signed)
History and Physical Interval Note:  04/20/2018 9:28 AM  Cassandra Mccann  has presented today for surgery, with the diagnosis of left breast sclerosing lesion  The various methods of treatment have been discussed with the patient and family.  Husband in room.  After consideration of risks, benefits and other options for treatment, the patient has consented to  Procedure(s): LEFT BREAST LUMPECTOMY WITH RADIOACTIVE SEED LOCALIZATION (Left) as a surgical intervention .  The patient's history has been reviewed, patient examined, no change in status, stable for surgery.  I have reviewed the patient's chart and labs.  Questions were answered to the patient's satisfaction.     Shann Medal

## 2018-04-20 NOTE — Anesthesia Preprocedure Evaluation (Signed)
Anesthesia Evaluation  Patient identified by MRN, date of birth, ID band Patient awake    Reviewed: Allergy & Precautions, NPO status , Patient's Chart, lab work & pertinent test results  History of Anesthesia Complications (+) PONV  Airway Mallampati: II  TM Distance: >3 FB Neck ROM: Full    Dental  (+) Dental Advisory Given   Pulmonary neg pulmonary ROS,    breath sounds clear to auscultation       Cardiovascular hypertension, Pt. on medications  Rhythm:Regular Rate:Normal     Neuro/Psych negative neurological ROS     GI/Hepatic Neg liver ROS, GERD  ,  Endo/Other  negative endocrine ROS  Renal/GU negative Renal ROS     Musculoskeletal   Abdominal   Peds  Hematology negative hematology ROS (+)   Anesthesia Other Findings   Reproductive/Obstetrics                             Anesthesia Physical Anesthesia Plan  ASA: II  Anesthesia Plan: General   Post-op Pain Management:    Induction: Intravenous  PONV Risk Score and Plan: 4 or greater and Scopolamine patch - Pre-op, Midazolam, Dexamethasone, Ondansetron and Treatment may vary due to age or medical condition  Airway Management Planned: LMA  Additional Equipment:   Intra-op Plan:   Post-operative Plan: Extubation in OR  Informed Consent: I have reviewed the patients History and Physical, chart, labs and discussed the procedure including the risks, benefits and alternatives for the proposed anesthesia with the patient or authorized representative who has indicated his/her understanding and acceptance.   Dental advisory given  Plan Discussed with: CRNA  Anesthesia Plan Comments:         Anesthesia Quick Evaluation

## 2018-04-20 NOTE — Discharge Instructions (Signed)
°  Post Anesthesia Home Care Instructions  Activity: Get plenty of rest for the remainder of the day. A responsible individual must stay with you for 24 hours following the procedure.  For the next 24 hours, DO NOT: -Drive a car -Paediatric nurse -Drink alcoholic beverages -Take any medication unless instructed by your physician -Make any legal decisions or sign important papers.  Meals: Start with liquid foods such as gelatin or soup. Progress to regular foods as tolerated. Avoid greasy, spicy, heavy foods. If nausea and/or vomiting occur, drink only clear liquids until the nausea and/or vomiting subsides. Call your physician if vomiting continues.  Special Instructions/Symptoms: Your throat may feel dry or sore from the anesthesia or the breathing tube placed in your throat during surgery. If this causes discomfort, gargle with warm salt water. The discomfort should disappear within 24 hours.  If you had a scopolamine patch placed behind your ear for the management of post- operative nausea and/or vomiting:  1. The medication in the patch is effective for 72 hours, after which it should be removed.  Wrap patch in a tissue and discard in the trash. Wash hands thoroughly with soap and water. 2. You may remove the patch earlier than 72 hours if you experience unpleasant side effects which may include dry mouth, dizziness or visual disturbances. 3. Avoid touching the patch. Wash your hands with soap and water after contact with the patch.   [facility] - DISCHARGE INSTRUCTIONS TO PATIENT  Activity:  Driving - May drive in one or 2 days   Lifting - No lifting more than 15 pounds for 5 days, then no limit  Wound Care:   Leave bandage on for 2 days, then you may remove the bandage  Diet:  As tolerated  Follow up appointment:  Call Dr. Pollie Friar office [facility]) at [phone] for an appointment in 2 to 4 weeks.  Medications and dosages:  Resume your home  medications.  You have a prescription for:  Vicodin  Call Dr. Lucia Gaskins or his office  [phone]) if you have:  Temperature greater than 100.4,  Persistent nausea and vomiting,  Severe uncontrolled pain,  Redness, tenderness, or signs of infection (pain, swelling, redness, odor or green/yellow discharge around the site),  Difficulty breathing, headache or visual disturbances,  Any other questions or concerns you may have after discharge.  In an emergency, call 911 or go to an Emergency Department at a nearby hospital.

## 2018-04-20 NOTE — Transfer of Care (Signed)
Immediate Anesthesia Transfer of Care Note  Patient: Cassandra Mccann  Procedure(s) Performed: LEFT BREAST LUMPECTOMY WITH RADIOACTIVE SEED LOCALIZATION (Left Breast)  Patient Location: PACU  Anesthesia Type:General  Level of Consciousness: awake, alert  and oriented  Airway & Oxygen Therapy: Patient Spontanous Breathing and Patient connected to face mask oxygen  Post-op Assessment: Report given to RN and Post -op Vital signs reviewed and stable  Post vital signs: Reviewed and stable  Last Vitals:  Vitals Value Taken Time  BP 123/50 04/20/2018 10:45 AM  Temp    Pulse 76 04/20/2018 10:48 AM  Resp 15 04/20/2018 10:48 AM  SpO2 100 % 04/20/2018 10:48 AM  Vitals shown include unvalidated device data.  Last Pain:  Vitals:   04/20/18 0817  TempSrc: Oral  PainSc: 0-No pain         Complications: No apparent anesthesia complications

## 2018-04-20 NOTE — Anesthesia Postprocedure Evaluation (Signed)
Anesthesia Post Note  Patient: Cassandra Mccann  Procedure(s) Performed: LEFT BREAST LUMPECTOMY WITH RADIOACTIVE SEED LOCALIZATION (Left Breast)     Patient location during evaluation: PACU Anesthesia Type: General Level of consciousness: awake and alert Pain management: pain level controlled Vital Signs Assessment: post-procedure vital signs reviewed and stable Respiratory status: spontaneous breathing, nonlabored ventilation, respiratory function stable and patient connected to nasal cannula oxygen Cardiovascular status: blood pressure returned to baseline and stable Postop Assessment: no apparent nausea or vomiting Anesthetic complications: no    Last Vitals:  Vitals:   04/20/18 1107 04/20/18 1120  BP:  109/87  Pulse: (!) 58 73  Resp: (!) 21 18  Temp:  36.8 C  SpO2: 100% 99%    Last Pain:  Vitals:   04/20/18 1120  TempSrc:   PainSc: 0-No pain                 [name]

## 2018-04-20 NOTE — Anesthesia Procedure Notes (Signed)
Procedure Name: LMA Insertion Date/Time: 04/20/2018 9:49 AM Performed by: Gwyndolyn Saxon, CRNA Pre-anesthesia Checklist: Patient identified, Emergency Drugs available, Suction available and Patient being monitored Patient Re-evaluated:Patient Re-evaluated prior to induction Oxygen Delivery Method: Circle system utilized Preoxygenation: Pre-oxygenation with 100% oxygen Induction Type: IV induction Ventilation: Mask ventilation without difficulty LMA: LMA inserted LMA Size: 4.0 Number of attempts: 1 Placement Confirmation: positive ETCO2 and breath sounds checked- equal and bilateral Tube secured with: Tape Dental Injury: Teeth and Oropharynx as per pre-operative assessment

## 2018-04-24 ENCOUNTER — Encounter (HOSPITAL_BASED_OUTPATIENT_CLINIC_OR_DEPARTMENT_OTHER): Payer: Self-pay | Admitting: Surgery

## 2018-05-14 DIAGNOSIS — Z6831 Body mass index (BMI) 31.0-31.9, adult: Secondary | ICD-10-CM | POA: Diagnosis not present

## 2018-05-14 DIAGNOSIS — Z01419 Encounter for gynecological examination (general) (routine) without abnormal findings: Secondary | ICD-10-CM | POA: Diagnosis not present

## 2018-05-15 DIAGNOSIS — Z8052 Family history of malignant neoplasm of bladder: Secondary | ICD-10-CM | POA: Diagnosis not present

## 2018-05-15 DIAGNOSIS — Z808 Family history of malignant neoplasm of other organs or systems: Secondary | ICD-10-CM | POA: Diagnosis not present

## 2018-05-15 DIAGNOSIS — Z803 Family history of malignant neoplasm of breast: Secondary | ICD-10-CM | POA: Diagnosis not present

## 2018-05-15 DIAGNOSIS — Z853 Personal history of malignant neoplasm of breast: Secondary | ICD-10-CM | POA: Diagnosis not present

## 2018-06-17 ENCOUNTER — Ambulatory Visit
Admission: EM | Admit: 2018-06-17 | Discharge: 2018-06-17 | Disposition: A | Discharge status: Home / Self Care | Payer: BLUE CROSS/BLUE SHIELD | Attending: Family Medicine | Admitting: Family Medicine

## 2018-06-17 DIAGNOSIS — J069 Acute upper respiratory infection, unspecified: Secondary | ICD-10-CM | POA: Diagnosis not present

## 2018-06-17 NOTE — ED Triage Notes (Signed)
Pt c/o nasal/chest congestion with productive cough

## 2018-06-17 NOTE — ED Provider Notes (Signed)
Cassandra Mccann   [id number] 06/17/18 Arrival Time: 7782  ASSESSMENT & PLAN:  1. Acute upper respiratory infection    Today is day #3.  Prefers OTC symptom care as needed. Ensure adequate fluid intake and rest. May f/u with PCP or here as needed.  Reviewed expectations re: course of current medical issues. Questions answered. Outlined signs and symptoms indicating need for more acute intervention. Patient verbalized understanding. After Visit Summary given.   SUBJECTIVE: History from: patient.  Cassandra Mccann is a 48 y.o. female who presents with complaint of nasal congestion, post-nasal drainage, and a persistent dry cough; without sore throat. Onset abrupt, 2 days ago; without fatigue and without body aches. SOB: none. Wheezing: she questions at times; vs upper airway noise. No h/o asthma. Fever: no. Overall normal PO intake without n/v. Known sick contacts: no. No specific or significant aggravating or alleviating factors reported. OTC treatment: NyQuil; helps but causes drowsiness.   Social History   Tobacco Use  Smoking Status Never Smoker  Smokeless Tobacco Never Used    ROS: As per HPI.   OBJECTIVE:  Vitals:   06/17/18 1133  BP: 120/83  Pulse: 75  Resp: 18  Temp: 98.5 F (36.9 C)  TempSrc: Oral  SpO2: 99%     General appearance: alert; appears fatigued HEENT: nasal congestion; clear runny nose; throat irritation secondary to post-nasal drainage Neck: supple without LAD CV: RRR Lungs: unlabored respirations, symmetrical air entry without wheezing; cough: mild Abd: soft Ext: no LE edema Skin: warm and dry Psychological: alert and cooperative; normal mood and affect   Allergies  Allergen Reactions  . Codeine Other (See Comments)    Unknown. Can take Percocet.  Marland Kitchen Penicillins Other (See Comments)    Unknown childhood reaction  . Sulfonamide Derivatives Nausea And Vomiting    Past Medical History:  Diagnosis Date  . Allergy   .  Anxiety   . Complication of anesthesia   . 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 Endometrial hyperplasia 06/2012  . GERD (gastroesophageal reflux disease)   . Hyperlipidemia   . Hypertension   . Personal history of radiation therapy 2011   Right Breast Cancer  . PONV (postoperative nausea and vomiting)   . Umbilical hernia 07/2351   Family History  Problem Relation Age of Onset  . Stroke Mother   . Hypertension Mother   . Breast cancer Mother   . Heart disease Mother   . Hyperlipidemia Mother   . Bladder Cancer Father        mets   Social History   Socioeconomic History  . Marital status: Married    Spouse name: Not on file  . Number of children: 2  . Years of education: Not on file  . Highest education level: Not on file  Occupational History  . Occupation: dental hygenist  Social Needs  . Financial resource strain: Not on file  . Food insecurity:    Worry: Not on file    Inability: Not on file  . Transportation needs:    Medical: Not on file    Non-medical: Not on file  Tobacco Use  . Smoking status: Never Smoker  . Smokeless tobacco: Never Used  Substance and Sexual Activity  . Alcohol use: Yes    Alcohol/week: 2.0 - 3.0 standard drinks    Types: 2 - 3 Standard drinks or equivalent per week    Comment: occasionally  . Drug use: No  .  Sexual activity: Yes    Birth control/protection: Surgical    Comment: Hysterectomy and Husband vasectomy  Lifestyle  . Physical activity:    Days per week: Not on file    Minutes per session: Not on file  . Stress: Not on file  Relationships  . Social connections:    Talks on phone: Not on file    Gets together: Not on file    Attends religious service: Not on file    Active member of club or organization: Not on file    Attends meetings of clubs or organizations: Not on file    Relationship status: Not on file  . Intimate partner violence:    Fear of current or ex partner: Not on file     Emotionally abused: Not on file    Physically abused: Not on file    Forced sexual activity: Not on file  Other Topics Concern  . Not on file  Social History Narrative  . Not on file           Vanessa Kick, MD 06/17/18 1239

## 2018-07-02 DIAGNOSIS — Z809 Family history of malignant neoplasm, unspecified: Secondary | ICD-10-CM | POA: Diagnosis not present

## 2018-07-10 DIAGNOSIS — Z0000 Encounter for general adult medical examination without abnormal findings: Secondary | ICD-10-CM | POA: Diagnosis not present

## 2018-07-10 DIAGNOSIS — I10 Essential (primary) hypertension: Secondary | ICD-10-CM | POA: Diagnosis not present

## 2018-07-10 DIAGNOSIS — R82998 Other abnormal findings in urine: Secondary | ICD-10-CM | POA: Diagnosis not present

## 2018-07-17 DIAGNOSIS — Z853 Personal history of malignant neoplasm of breast: Secondary | ICD-10-CM | POA: Diagnosis not present

## 2018-07-17 DIAGNOSIS — Z0000 Encounter for general adult medical examination without abnormal findings: Secondary | ICD-10-CM | POA: Diagnosis not present

## 2018-07-17 DIAGNOSIS — E669 Obesity, unspecified: Secondary | ICD-10-CM | POA: Diagnosis not present

## 2018-07-17 DIAGNOSIS — E7849 Other hyperlipidemia: Secondary | ICD-10-CM | POA: Diagnosis not present

## 2018-07-17 DIAGNOSIS — M25531 Pain in right wrist: Secondary | ICD-10-CM | POA: Diagnosis not present

## 2018-07-27 DIAGNOSIS — J309 Allergic rhinitis, unspecified: Secondary | ICD-10-CM | POA: Diagnosis not present

## 2018-07-27 DIAGNOSIS — J329 Chronic sinusitis, unspecified: Secondary | ICD-10-CM | POA: Diagnosis not present

## 2018-09-12 DIAGNOSIS — H66002 Acute suppurative otitis media without spontaneous rupture of ear drum, left ear: Secondary | ICD-10-CM | POA: Diagnosis not present

## 2018-10-13 DIAGNOSIS — L905 Scar conditions and fibrosis of skin: Secondary | ICD-10-CM | POA: Diagnosis not present

## 2018-10-21 ENCOUNTER — Other Ambulatory Visit: Payer: Self-pay | Admitting: Internal Medicine

## 2018-10-21 DIAGNOSIS — E785 Hyperlipidemia, unspecified: Secondary | ICD-10-CM

## 2018-10-21 DIAGNOSIS — R0789 Other chest pain: Secondary | ICD-10-CM

## 2018-12-02 ENCOUNTER — Ambulatory Visit
Admission: RE | Admit: 2018-12-02 | Discharge: 2018-12-02 | Disposition: A | Discharge status: Home / Self Care | Payer: BLUE CROSS/BLUE SHIELD | Source: Ambulatory Visit | Attending: Internal Medicine | Admitting: Internal Medicine

## 2018-12-02 ENCOUNTER — Other Ambulatory Visit: Payer: Self-pay

## 2018-12-02 DIAGNOSIS — E785 Hyperlipidemia, unspecified: Secondary | ICD-10-CM

## 2018-12-02 DIAGNOSIS — R0789 Other chest pain: Secondary | ICD-10-CM

## 2018-12-11 ENCOUNTER — Other Ambulatory Visit: Payer: BLUE CROSS/BLUE SHIELD

## 2019-01-06 DIAGNOSIS — M659 Synovitis and tenosynovitis, unspecified: Secondary | ICD-10-CM | POA: Diagnosis not present

## 2019-01-06 DIAGNOSIS — M25562 Pain in left knee: Secondary | ICD-10-CM | POA: Diagnosis not present

## 2019-01-12 DIAGNOSIS — R0789 Other chest pain: Secondary | ICD-10-CM | POA: Diagnosis not present

## 2019-01-12 DIAGNOSIS — R002 Palpitations: Secondary | ICD-10-CM | POA: Diagnosis not present

## 2019-01-21 ENCOUNTER — Other Ambulatory Visit: Payer: Self-pay

## 2019-01-21 ENCOUNTER — Ambulatory Visit: Payer: BLUE CROSS/BLUE SHIELD | Admitting: Cardiology

## 2019-01-21 ENCOUNTER — Encounter: Payer: Self-pay | Admitting: Cardiology

## 2019-01-21 ENCOUNTER — Ambulatory Visit: Payer: BC Managed Care – PPO

## 2019-01-21 VITALS — BP 116/80 | HR 63 | Temp 97.5°F | Ht 66.5 in | Wt 175.0 lb

## 2019-01-21 DIAGNOSIS — R55 Syncope and collapse: Secondary | ICD-10-CM | POA: Diagnosis not present

## 2019-01-21 DIAGNOSIS — I10 Essential (primary) hypertension: Secondary | ICD-10-CM | POA: Diagnosis not present

## 2019-01-21 DIAGNOSIS — R002 Palpitations: Secondary | ICD-10-CM | POA: Diagnosis not present

## 2019-01-21 DIAGNOSIS — R42 Dizziness and giddiness: Secondary | ICD-10-CM

## 2019-01-21 DIAGNOSIS — R0789 Other chest pain: Secondary | ICD-10-CM | POA: Diagnosis not present

## 2019-01-21 NOTE — Progress Notes (Signed)
Primary Physician:  Shon Baton, MD   Patient ID: Cassandra Mccann, female    DOB: [birth year], 48 y.o.   MRN: [id number]  Subjective:    Chief Complaint  Patient presents with  . New Patient (Initial Visit)    HPI: Cassandra Mccann  is a 48 y.o. female  with history of breast cancer in right breast s/p lupectomy, radiation and chemotherapy in 2011, hypertension, hyperlipidemia, referred to [place] for chest pain.  She has occasional chest pressure and awareness of heart beat. She was riding in the car, reading a book, 1 week ago and suddenly had shaky, dizziness feeling that lasted for 30 minutes. Does not feel related hypoglycemia. Felt extremely tired for 1 day afterward. She also had a similar episode yesterday, that happened while working on a patient, but was not as severe. Seems to occur with bending her neck and looking down. States that she feels a little bit "swimmy headed".   She now is having issues with irritability and anxiety that seems to be worsening. Seems to be worsened in the car and potentially related to someone that she knew of that was killed in a car wreck 1 year ago. Has occasional hot flashes. Had hysterectomy but has both ovaries still in place.   She has had negative calcium score in August 2020. She does not exercise, but is fairly active with maintaining her house and doing regular activities. No exertional chest pain.   She works as a Copywriter, advertising. She is married with 2 children.  Past Medical History:  Diagnosis Date  . Allergy   . Anxiety   . Complication of anesthesia   . 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 Endometrial hyperplasia 06/2012  . GERD (gastroesophageal reflux disease)   . Hyperlipidemia   . Hypertension   . Personal history of radiation therapy 2011   Right Breast Cancer  . PONV (postoperative nausea and vomiting)   . Umbilical hernia 08/6431    Past Surgical History:  Procedure Laterality Date  .  AUGMENTATION MAMMAPLASTY Right 2005  . BREAST ENHANCEMENT SURGERY  2005   implants  . BREAST LUMPECTOMY Right 2011  . BREAST LUMPECTOMY WITH RADIOACTIVE SEED LOCALIZATION Left 04/20/2018   Procedure: LEFT BREAST LUMPECTOMY WITH RADIOACTIVE SEED LOCALIZATION;  Surgeon: Alphonsa Overall, MD;  Location: [facility];  Service: General;  Laterality: Left;  . LAPAROSCOPIC ASSISTED VAGINAL HYSTERECTOMY N/A 07/13/2012   Procedure: LAPAROSCOPIC ASSISTED VAGINAL HYSTERECTOMY;  Surgeon: Margarette Asal, MD;  Location: [facility];  Service: Gynecology;  Laterality: N/A;  . UMBILICAL HERNIA REPAIR      Social History   Socioeconomic History  . Marital status: Married    Spouse name: Not on file  . Number of children: 2  . Years of education: Not on file  . Highest education level: Not on file  Occupational History  . Occupation: dental hygenist  Social Needs  . Financial resource strain: Not on file  . Food insecurity    Worry: Not on file    Inability: Not on file  . Transportation needs    Medical: Not on file    Non-medical: Not on file  Tobacco Use  . Smoking status: Never Smoker  . Smokeless tobacco: Never Used  Substance and Sexual Activity  . Alcohol use: Yes    Alcohol/week: 2.0 - 3.0 standard drinks    Types: 2 - 3 Standard drinks or equivalent per week  Comment: occasionally  . Drug use: No  . Sexual activity: Yes    Birth control/protection: Surgical    Comment: Hysterectomy and Husband vasectomy  Lifestyle  . Physical activity    Days per week: Not on file    Minutes per session: Not on file  . Stress: Not on file  Relationships  . Social Herbalist on phone: Not on file    Gets together: Not on file    Attends religious service: Not on file    Active member of club or organization: Not on file    Attends meetings of clubs or organizations: Not on file    Relationship status: Not on file  . Intimate partner violence    Fear of current or ex  partner: Not on file    Emotionally abused: Not on file    Physically abused: Not on file    Forced sexual activity: Not on file  Other Topics Concern  . Not on file  Social History Narrative  . Not on file    Review of Systems  Constitution: Negative for decreased appetite, malaise/fatigue, weight gain and weight loss.  Eyes: Negative for visual disturbance.  Cardiovascular: Positive for chest pain. Negative for claudication, dyspnea on exertion, leg swelling, orthopnea, palpitations and syncope.  Respiratory: Negative for hemoptysis and wheezing.   Endocrine: Negative for cold intolerance and heat intolerance.  Hematologic/Lymphatic: Does not bruise/bleed easily.  Skin: Negative for nail changes.  Musculoskeletal: Negative for muscle weakness and myalgias.  Gastrointestinal: Negative for abdominal pain, change in bowel habit, nausea and vomiting.  Neurological: Positive for dizziness. Negative for difficulty with concentration, focal weakness and headaches.  Psychiatric/Behavioral: Negative for altered mental status and suicidal ideas.  All other systems reviewed and are negative.     Objective:  Blood pressure 116/80, pulse 63, temperature (!) 97.5 F (36.4 C), height 5' 6.5" (1.689 m), weight 175 lb (79.4 kg), last menstrual period 06/23/2012, SpO2 100 %. Body mass index is 27.82 kg/m.    Physical Exam  Constitutional: She is oriented to person, place, and time. Vital signs are normal. She appears well-developed and well-nourished.  HENT:  Head: Normocephalic and atraumatic.  Neck: Normal range of motion.  Cardiovascular: Normal rate, regular rhythm and intact distal pulses. Exam reveals a midsystolic click.  Murmur heard.  Mid to late systolic murmur is present at the apex. Pulmonary/Chest: Effort normal and breath sounds normal. No accessory muscle usage. No respiratory distress.  Abdominal: Soft. Bowel sounds are normal.  Musculoskeletal: Normal range of motion.   Neurological: She is alert and oriented to person, place, and time.  Skin: Skin is warm and dry.  Vitals reviewed.  Radiology:  CT cardiac scoring 12/02/2018: Coronary calcium score of 0.  No significant incidental findings.  Laboratory examination:   01/12/2019: Magnesium normal.  Creatinine 1.0, EGFR 58/21, potassium 4.8, normal.  CBC normal.  TSH normal.  CMP Latest Ref Rng & Units 12/30/2017 03/03/2015 07/10/2012  Glucose 70 - 99 mg/dL 95 84 87  BUN 6 - 20 mg/dL '10 13 14  ' Creatinine 0.44 - 1.00 mg/dL 0.84 0.78 0.83  Sodium 135 - 145 mmol/L 139 139 140  Potassium 3.5 - 5.1 mmol/L 4.1 4.9 4.7  Chloride 98 - 111 mmol/L 103 107 106  CO2 22 - 32 mmol/L '25 26 24  ' Calcium 8.9 - 10.3 mg/dL 9.3 9.3 9.1  Total Protein 6.0 - 8.3 g/dL - 6.5 -  Total Bilirubin 0.2 - 1.2 mg/dL -  0.6 -  Alkaline Phos 39 - 117 U/L - 83 -  AST 0 - 37 U/L - 18 -  ALT 0 - 35 U/L - 18 -   CBC Latest Ref Rng & Units 12/30/2017 07/14/2012 07/14/2012  WBC 4.0 - 10.5 K/uL 8.4 - 10.2  Hemoglobin 12.0 - 15.0 g/dL 13.1 - 10.1(L)  Hematocrit 36.0 - 46.0 % 40.6 - 30.5(L)  Platelets 150 - 400 K/uL PLATELET CLUMPS NOTED ON SMEAR, UNABLE TO ESTIMATE 217 PLATELET CLUMPING, SUGGEST RECOLLECTION OF SAMPLE IN CITRATE TUBE.   Lipid Panel     Component Value Date/Time   CHOL 211 (H) 03/03/2015 1113   TRIG 63.0 03/03/2015 1113   HDL 68.80 03/03/2015 1113   CHOLHDL 3 03/03/2015 1113   VLDL 12.6 03/03/2015 1113   LDLCALC 129 (H) 03/03/2015 1113   LDLDIRECT 102.7 03/18/2012 0924   HEMOGLOBIN A1C No results found for: HGBA1C, MPG TSH No results for input(s): TSH in the last 8760 hours.  PRN Meds:. Medications Discontinued During This Encounter  Medication Reason  . HYDROcodone-acetaminophen (NORCO/VICODIN) 5-325 MG tablet Error  . ibuprofen (ADVIL,MOTRIN) 800 MG tablet Error  . omeprazole (PRILOSEC) 20 MG capsule Error   Current Meds  Medication Sig  . ALPRAZolam (XANAX) 0.25 MG tablet Take 1 tablet (0.25 mg total) by  mouth daily as needed for anxiety.  . cetirizine (ZYRTEC) 10 MG tablet TAKE 1 TABLET(10 MG) BY MOUTH DAILY  . lisinopril (PRINIVIL,ZESTRIL) 10 MG tablet Take 0.5 tablets (5 mg total) by mouth daily.    Cardiac Studies:     Assessment:   Postural dizziness with near syncope - Plan: EKG 12-Lead, PCV ECHOCARDIOGRAM COMPLETE, Cardiac event monitor  Atypical chest pain - Plan: PCV ECHOCARDIOGRAM COMPLETE  Palpitations  Essential hypertension, benign - Plan: EKG 12-Lead  EKG 01/21/2019: Sinus bradycardia at 58 bpm, normal axis, no evidence of ischemia.   Recommendations:   Patient has been having episodes of chest pressure and sensation to take a deep breath and awareness of her heartbeat in her throat.  She had occasional PVC on examination.  She is also recently developed episodes of dizziness and feeling of near syncope, but do appear to be positional.  She is not noted to be orthostatic.  I will place her on 30-day event monitor to exclude any cardiac arrhythmia.  She also has systolic murmur on exam, will obtain echocardiogram to exclude any structural abnormalities.  I do suspect that her episodes of dizziness may also be contributed by potential positional vertigo.  I have asked her to try meclizine to see if her symptoms will improve.  Could also potentially be related to sinus issues.  Do not feel that she needs stress testing at this time as she is very active without exertional chest discomfort and she is recently had calcium score CT that was 0.  I will see her back after the test for further recommendations and reevaluation.   *I have discussed this case with Dr. Virgina Jock and he personally examined the patient and participated in formulating the plan.*    Miquel Dunn, MSN, APRN, FNP-C [facility]. [place] Office: [phone] Fax: [phone]

## 2019-01-22 ENCOUNTER — Other Ambulatory Visit: Payer: BC Managed Care – PPO

## 2019-02-02 ENCOUNTER — Ambulatory Visit (INDEPENDENT_AMBULATORY_CARE_PROVIDER_SITE_OTHER): Payer: BC Managed Care – PPO

## 2019-02-02 DIAGNOSIS — R55 Syncope and collapse: Secondary | ICD-10-CM | POA: Diagnosis not present

## 2019-02-02 DIAGNOSIS — R0789 Other chest pain: Secondary | ICD-10-CM

## 2019-02-02 DIAGNOSIS — R42 Dizziness and giddiness: Secondary | ICD-10-CM | POA: Diagnosis not present

## 2019-02-18 ENCOUNTER — Other Ambulatory Visit: Payer: Self-pay

## 2019-02-18 ENCOUNTER — Ambulatory Visit: Payer: BC Managed Care – PPO | Admitting: Cardiology

## 2019-02-18 ENCOUNTER — Encounter: Payer: Self-pay | Admitting: Cardiology

## 2019-02-18 ENCOUNTER — Telehealth: Payer: Self-pay

## 2019-02-18 VITALS — BP 134/76 | HR 63 | Ht 66.5 in | Wt 176.9 lb

## 2019-02-18 DIAGNOSIS — R55 Syncope and collapse: Secondary | ICD-10-CM | POA: Diagnosis not present

## 2019-02-18 DIAGNOSIS — R42 Dizziness and giddiness: Secondary | ICD-10-CM

## 2019-02-18 DIAGNOSIS — I272 Pulmonary hypertension, unspecified: Secondary | ICD-10-CM

## 2019-02-18 DIAGNOSIS — R002 Palpitations: Secondary | ICD-10-CM

## 2019-02-18 NOTE — Progress Notes (Signed)
Primary Physician:  Shon Baton, MD   Patient ID: Cassandra Mccann, female    DOB: Jun 01, 1970, 48 y.o.   MRN: [id number]  Subjective:    Chief Complaint  Patient presents with  . Palpitations  . Dizziness  . Results    echo, monitor  . Follow-up    4wk    HPI: Cassandra Mccann  is a 48 y.o. female  with history of breast cancer in right breast s/p lupectomy, radiation and chemotherapy in 2011, hypertension, hyperlipidemia, recently evaluated by [place] chest pain, dizziness, and palpitations.   She underwent echocardiogram and now presents to discuss results. She is still wearing her heart monitor.  She has had a few episodes of dizziness, but nothing as severe as before. Today, she denies any chest pain or shortness of breath. Has had a few palpitations.   She has had negative calcium score in August 2020. She does not exercise, but is fairly active with maintaining her house and doing regular activities. No exertional chest pain.   She works as a Copywriter, advertising. She is married with 2 children.  Past Medical History:  Diagnosis Date  . Allergy   . Anxiety   . Complication of anesthesia   . 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 Endometrial hyperplasia 06/2012  . GERD (gastroesophageal reflux disease)   . Hyperlipidemia   . Hypertension   . Personal history of radiation therapy 2011   Right Breast Cancer  . PONV (postoperative nausea and vomiting)   . Umbilical hernia 05/8766    Past Surgical History:  Procedure Laterality Date  . AUGMENTATION MAMMAPLASTY Right 2005  . BREAST ENHANCEMENT SURGERY  2005   implants  . BREAST LUMPECTOMY Right 2011  . BREAST LUMPECTOMY WITH RADIOACTIVE SEED LOCALIZATION Left 04/20/2018   Procedure: LEFT BREAST LUMPECTOMY WITH RADIOACTIVE SEED LOCALIZATION;  Surgeon: Alphonsa Overall, MD;  Location: [facility];  Service: General;  Laterality: Left;  . LAPAROSCOPIC ASSISTED VAGINAL HYSTERECTOMY N/A 07/13/2012    Procedure: LAPAROSCOPIC ASSISTED VAGINAL HYSTERECTOMY;  Surgeon: Margarette Asal, MD;  Location: [facility];  Service: Gynecology;  Laterality: N/A;  . UMBILICAL HERNIA REPAIR      Social History   Socioeconomic History  . Marital status: Married    Spouse name: Not on file  . Number of children: 2  . Years of education: Not on file  . Highest education level: Not on file  Occupational History  . Occupation: dental hygenist  Social Needs  . Financial resource strain: Not on file  . Food insecurity    Worry: Not on file    Inability: Not on file  . Transportation needs    Medical: Not on file    Non-medical: Not on file  Tobacco Use  . Smoking status: Never Smoker  . Smokeless tobacco: Never Used  Substance and Sexual Activity  . Alcohol use: Yes    Alcohol/week: 2.0 - 3.0 standard drinks    Types: 2 - 3 Standard drinks or equivalent per week    Comment: occasionally  . Drug use: No  . Sexual activity: Yes    Birth control/protection: Surgical    Comment: Hysterectomy and Husband vasectomy  Lifestyle  . Physical activity    Days per week: Not on file    Minutes per session: Not on file  . Stress: Not on file  Relationships  . Social connections    Talks on phone: Not on file  Gets together: Not on file    Attends religious service: Not on file    Active member of club or organization: Not on file    Attends meetings of clubs or organizations: Not on file    Relationship status: Not on file  . Intimate partner violence    Fear of current or ex partner: Not on file    Emotionally abused: Not on file    Physically abused: Not on file    Forced sexual activity: Not on file  Other Topics Concern  . Not on file  Social History Narrative  . Not on file    Review of Systems  Constitution: Negative for decreased appetite, malaise/fatigue, weight gain and weight loss.  Eyes: Negative for visual disturbance.  Cardiovascular: Negative for chest pain, claudication,  dyspnea on exertion, leg swelling, orthopnea, palpitations and syncope.  Respiratory: Negative for hemoptysis and wheezing.   Endocrine: Negative for cold intolerance and heat intolerance.  Hematologic/Lymphatic: Does not bruise/bleed easily.  Skin: Negative for nail changes.  Musculoskeletal: Negative for muscle weakness and myalgias.  Gastrointestinal: Negative for abdominal pain, change in bowel habit, nausea and vomiting.  Neurological: Positive for dizziness. Negative for difficulty with concentration, focal weakness and headaches.  Psychiatric/Behavioral: Negative for altered mental status and suicidal ideas.  All other systems reviewed and are negative.     Objective:  Blood pressure 134/76, pulse 63, height 5' 6.5" (1.689 m), weight 176 lb 14.4 oz (80.2 kg), last menstrual period 06/23/2012, SpO2 100 %. Body mass index is 28.12 kg/m.    Physical Exam  Constitutional: She is oriented to person, place, and time. Vital signs are normal. She appears well-developed and well-nourished.  HENT:  Head: Normocephalic and atraumatic.  Neck: Normal range of motion.  Cardiovascular: Normal rate, regular rhythm and intact distal pulses. Exam reveals a midsystolic click.  Murmur heard.  Mid to late systolic murmur is present at the apex. Pulmonary/Chest: Effort normal and breath sounds normal. No accessory muscle usage. No respiratory distress.  Abdominal: Soft. Bowel sounds are normal.  Musculoskeletal: Normal range of motion.  Neurological: She is alert and oriented to person, place, and time.  Skin: Skin is warm and dry.  Vitals reviewed.  Radiology:  CT cardiac scoring 12/02/2018: Coronary calcium score of 0.  No significant incidental findings.  Laboratory examination:   01/12/2019: Magnesium normal.  Creatinine 1.0, EGFR 58/21, potassium 4.8, normal.  CBC normal.  TSH normal.  CMP Latest Ref Rng & Units 12/30/2017 03/03/2015 07/10/2012  Glucose 70 - 99 mg/dL 95 84 87  BUN 6 - 20  mg/dL _0 Creatinine 0.44 - 1.00 mg/dL 0.84 0.78 0.83  Sodium 135 - 145 mmol/L 139 139 140  Potassium 3.5 - 5.1 mmol/L 4.1 4.9 4.7  Chloride 98 - 111 mmol/L 103 107 106  CO2 22 - 32 mmol/L _1 Calcium 8.9 - 10.3 mg/dL 9.3 9.3 9.1  Total Protein 6.0 - 8.3 g/dL - 6.5 -  Total Bilirubin 0.2 - 1.2 mg/dL - 0.6 -  Alkaline Phos 39 - 117 U/L - 83 -  AST 0 - 37 U/L - 18 -  ALT 0 - 35 U/L - 18 -   CBC Latest Ref Rng & Units 12/30/2017 07/14/2012 07/14/2012  WBC 4.0 - 10.5 K/uL 8.4 - 10.2  Hemoglobin 12.0 - 15.0 g/dL 13.1 - 10.1(L)  Hematocrit 36.0 - 46.0 % 40.6 - 30.5(L)  Platelets 150 - 400 K/uL PLATELET CLUMPS NOTED ON SMEAR, UNABLE  TO ESTIMATE 217 PLATELET CLUMPING, SUGGEST RECOLLECTION OF SAMPLE IN CITRATE TUBE.   Lipid Panel     Component Value Date/Time   CHOL 211 (H) 03/03/2015 1113   TRIG 63.0 03/03/2015 1113   HDL 68.80 03/03/2015 1113   CHOLHDL 3 03/03/2015 1113   VLDL 12.6 03/03/2015 1113   LDLCALC 129 (H) 03/03/2015 1113   LDLDIRECT 102.7 03/18/2012 0924   HEMOGLOBIN A1C No results found for: HGBA1C, MPG TSH No results for input(s): TSH in the last 8760 hours.  PRN Meds:. There are no discontinued medications. Current Meds  Medication Sig  . ALPRAZolam (XANAX) 0.25 MG tablet Take 1 tablet (0.25 mg total) by mouth daily as needed for anxiety.  . cetirizine (ZYRTEC) 10 MG tablet TAKE 1 TABLET(10 MG) BY MOUTH DAILY  . lisinopril (PRINIVIL,ZESTRIL) 10 MG tablet Take 0.5 tablets (5 mg total) by mouth daily.  Marland Kitchen omeprazole (PRILOSEC) 10 MG capsule Take 10 mg by mouth daily.    Cardiac Studies:   Echocardiogram 02/02/2019: Normal LV systolic function with EF 55%. Mild concentric hypertrophy of the left ventricle. Left ventricle cavity is normal in size. Normal global wall motion. Normal diastolic filling pattern. Calculated EF 55%. Left atrial cavity is mildly dilated. Right atrial cavity is mildly dilated. Moderate (Grade II) mitral regurgitation. Wall-impinging  MR jet color flow area. Mild to moderate tricuspid regurgitation. Estimated pulmonary artery systolic pressure is 40 mmHg.   Assessment:   Pulmonary hypertension, unspecified ([place]) - Plan: NM Pulmonary Perf and Vent, Ambulatory referral to Neurology, PCV ECHOCARDIOGRAM COMPLETE  Postural dizziness with near syncope  Palpitations  EKG 01/21/2019: Sinus bradycardia at 58 bpm, normal axis, no evidence of ischemia.   Recommendations:   I have discussed recent echocardiogram results with the patient, she was surprisingly found to have elevated PA pressures of 40 mmHg. She is without history of COPD or asthma to explain this finding. Because of her episode of near syncope, will exclude PE etiology for this finding. Does report that her half brother and nephew have a clotting disorder, will obtain VQ scan to exclude this.   Will also have her seen by Neurology to exclude sleep apnea that could also be contributing to her mildly elevated PA pressures. If workup for both of these are unyielding, would recommend that we repeat echocardiogram in 6 months for reevaluation. If she continues to have elevated pressures at that time, will consider right heart cath for definitive evaluation. There are not any clinical findings of heart failure. She was educated on these symptoms and if she were to notice these, advised to contact us.  In regard to palpitations, she has not yet completed her monitor, but thus far no events have been reported. I will notify her of the results once available. If her VQ scan is unyielding, I have recommended that she again start back with exercising and working towards weight loss. I will see her back after her echocardiogram in 6 months for follow up.    *I have discussed this case with Dr. Virgina Jock and he personally examined the patient and participated in formulating the plan.*    Miquel Dunn, MSN, APRN, FNP-C [facility]. [place] Office: [phone]  Fax: [phone]

## 2019-02-22 ENCOUNTER — Other Ambulatory Visit: Payer: Self-pay | Admitting: Cardiology

## 2019-02-22 DIAGNOSIS — I272 Pulmonary hypertension, unspecified: Secondary | ICD-10-CM

## 2019-02-22 NOTE — Progress Notes (Signed)
CXR order placed.

## 2019-02-26 ENCOUNTER — Other Ambulatory Visit: Payer: Self-pay

## 2019-02-26 ENCOUNTER — Other Ambulatory Visit: Payer: Self-pay | Admitting: Cardiology

## 2019-02-26 ENCOUNTER — Encounter (HOSPITAL_COMMUNITY)
Admission: RE | Admit: 2019-02-26 | Discharge: 2019-02-26 | Disposition: A | Discharge status: Home / Self Care | Payer: BC Managed Care – PPO | Source: Ambulatory Visit | Attending: Cardiology | Admitting: Cardiology

## 2019-02-26 ENCOUNTER — Ambulatory Visit (HOSPITAL_COMMUNITY)
Admission: RE | Admit: 2019-02-26 | Discharge: 2019-02-26 | Disposition: A | Discharge status: Home / Self Care | Payer: BC Managed Care – PPO | Source: Ambulatory Visit | Attending: Cardiology | Admitting: Cardiology

## 2019-02-26 DIAGNOSIS — I272 Pulmonary hypertension, unspecified: Secondary | ICD-10-CM

## 2019-02-26 DIAGNOSIS — I270 Primary pulmonary hypertension: Secondary | ICD-10-CM | POA: Diagnosis not present

## 2019-02-26 MED ORDER — TECHNETIUM TO 99M ALBUMIN AGGREGATED
1.5000 | Freq: Once | INTRAVENOUS | Status: AC | PRN
  Administered 2019-02-26: 1.5 via INTRAVENOUS

## 2019-03-02 ENCOUNTER — Telehealth: Payer: Self-pay | Admitting: Cardiology

## 2019-03-02 NOTE — Telephone Encounter (Signed)
LVM in regards to results of her VQ scan. Advised to call for any questions.

## 2019-03-04 ENCOUNTER — Ambulatory Visit (INDEPENDENT_AMBULATORY_CARE_PROVIDER_SITE_OTHER): Payer: BC Managed Care – PPO | Admitting: Neurology

## 2019-03-04 ENCOUNTER — Encounter: Payer: Self-pay | Admitting: Neurology

## 2019-03-04 ENCOUNTER — Other Ambulatory Visit: Payer: Self-pay

## 2019-03-04 VITALS — BP 113/75 | HR 56 | Temp 97.7°F | Ht 66.5 in | Wt 178.0 lb

## 2019-03-04 DIAGNOSIS — I10 Essential (primary) hypertension: Secondary | ICD-10-CM | POA: Diagnosis not present

## 2019-03-04 DIAGNOSIS — K219 Gastro-esophageal reflux disease without esophagitis: Secondary | ICD-10-CM

## 2019-03-04 DIAGNOSIS — I272 Pulmonary hypertension, unspecified: Secondary | ICD-10-CM

## 2019-03-04 NOTE — Progress Notes (Addendum)
SLEEP MEDICINE CLINIC    Provider:  Larey Seat, MD  Primary Care Physician:  Shon Baton, [place] [address]     Referring Provider: Shon Baton, [place],  [place] [address]          Chief Complaint according to patient   Patient presents with:    . New Patient (Initial Visit)           HISTORY OF PRESENT ILLNESS:  Cassandra Mccann is a 48 y.o. year old White or Caucasian female patient seen here as a referral on 03/04/2019 from Dr Einar Gip and Dr.Russo for an evaluation of possible sleep apnea, I a patient with recent pulmonary hypertension, near syncope, vertigo, but no PE has been found. .  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 She has been dizzy when standing up and seated while evaluated in the cardiologist's office. BP was stabile. She snores. She drams vividly .    I have the pleasure of seeing Cassandra Mccann today, a right-handed White or Caucasian female with a possible sleep disorder.  She  has a past medical history of Allergy, Anxiety, Complication of anesthesia, Ductal carcinoma in situ of breast (2011), Endometrial hyperplasia (06/2012), GERD (gastroesophageal reflux disease), Hyperlipidemia, Hypertension, Personal history of radiation therapy (2011), PONV (postoperative nausea and vomiting), and Umbilical hernia (0000000). dr. Almedia Balls has seen her for neck tension" crooked neck". She lost weight and improved GERD.      Family medical /sleep history: half brother with OSA.   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 Family status is married  with a 36 year old daughter, son is already on his own. The patient currently works Mo through Thursday. Pets are present. Tobacco use: never. ETOH use socially, beer. Caffeine intake in form  of Coffee( 2 cups a day ) Soda( 3 a day when out for lunch /diet) Tea (none ) or energy drinks. Regular exercise in form of walking - but has plantar fasciitis.      Sleep habits are as follows: The patient's dinner time is between 7 PM.  The patient goes to bed at 10 PM and is asleep  In 30 minutes, but husband snores. She continues to sleep for several hours, wakes when her husband has  bathroom breaks. The preferred sleep position is lateral , with the support of 1 pillow. Dreams are reportedly  frequent/vivid.  7 AM is the usual rise time. The patient wakes up spontaneously with an alarm.  She reports not feeling refreshed or restored in AM, without dry mouth, and without morning headaches. She has bruxism.    Review of Systems: Out of a complete 14 system review, the patient complains of only the following symptoms, and all other reviewed systems are negative.:  Fatigue, sleepiness , snoring, fragmented sleep.  How likely are you to doze in the following situations: 0 = not likely, 1 = slight chance, 2 = moderate chance, 3 = high chance   Sitting and Reading? Watching Television? Sitting inactive in a public place (theater or meeting)? As a passenger in a car for an hour without a break? Lying down in the afternoon when circumstances permit? Sitting and talking to someone? Sitting quietly after  lunch without alcohol? In a car, while stopped for a few minutes in traffic?   Total = 5/ 24 points   FSS endorsed at 19/ 63 points.   Social History   Socioeconomic History  . Marital status: Married    Spouse name: Not on file  . Number of children: 2  . Years of education: Not on file  . Highest education level: Not on file  Occupational History  . Occupation: dental hygenist  Social Needs  . Financial resource strain: Not on file  . Food insecurity    Worry: Not on file    Inability: Not on file  . Transportation needs    Medical: Not on file    Non-medical: Not on file   Tobacco Use  . Smoking status: Never Smoker  . Smokeless tobacco: Never Used  Substance and Sexual Activity  . Alcohol use: Yes    Alcohol/week: 2.0 - 3.0 standard drinks    Types: 2 - 3 Standard drinks or equivalent per week    Comment: occasionally  . Drug use: No  . Sexual activity: Yes    Birth control/protection: Surgical    Comment: Hysterectomy and Husband vasectomy  Lifestyle  . Physical activity    Days per week: Not on file    Minutes per session: Not on file  . Stress: Not on file  Relationships  . Social Herbalist on phone: Not on file    Gets together: Not on file    Attends religious service: Not on file    Active member of club or organization: Not on file    Attends meetings of clubs or organizations: Not on file    Relationship status: Not on file  Other Topics Concern  . Not on file  Social History Narrative  . Not on file    Family History  Problem Relation Age of Onset  . Stroke Mother   . Hypertension Mother   . Breast cancer Mother   . Heart disease Mother   . Hyperlipidemia Mother   . Bladder Cancer Father        mets    Past Medical History:  Diagnosis Date  . Allergy   . Anxiety   . Complication of anesthesia   . 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 Endometrial hyperplasia 06/2012  . GERD (gastroesophageal reflux disease)   . Hyperlipidemia   . Hypertension   . Personal history of radiation therapy 2011   Right Breast Cancer  . PONV (postoperative nausea and vomiting)   . Umbilical hernia 0000000    Past Surgical History:  Procedure Laterality Date  . AUGMENTATION MAMMAPLASTY Right 2005  . BREAST ENHANCEMENT SURGERY  2005   implants  . BREAST LUMPECTOMY Right 2011  . BREAST LUMPECTOMY WITH RADIOACTIVE SEED LOCALIZATION Left 04/20/2018   Procedure: LEFT BREAST LUMPECTOMY WITH RADIOACTIVE SEED LOCALIZATION;  Surgeon: Alphonsa Overall, MD;  Location: [facility];  Service: General;   Laterality: Left;  . LAPAROSCOPIC ASSISTED VAGINAL HYSTERECTOMY N/A 07/13/2012   Procedure: LAPAROSCOPIC ASSISTED VAGINAL HYSTERECTOMY;  Surgeon: Margarette Asal, MD;  Location: [facility];  Service: Gynecology;  Laterality: N/A;  . UMBILICAL HERNIA REPAIR       Current Outpatient Medications on File Prior to Visit  Medication Sig Dispense Refill  . ALPRAZolam (XANAX) 0.25 MG tablet Take 1 tablet (0.25 mg total) by mouth daily as needed for anxiety. 30 tablet 0  .  cetirizine (ZYRTEC) 10 MG tablet TAKE 1 TABLET(10 MG) BY MOUTH DAILY 30 tablet 0  . lisinopril (PRINIVIL,ZESTRIL) 10 MG tablet Take 0.5 tablets (5 mg total) by mouth daily. 45 tablet 0  . omeprazole (PRILOSEC) 10 MG capsule Take 10 mg by mouth daily.     No current facility-administered medications on file prior to visit.     Allergies  Allergen Reactions  . Sulfa Antibiotics Nausea Only  . Codeine Other (See Comments)    Unknown. Can take Percocet.  Marland Kitchen Penicillins Other (See Comments)    Unknown childhood reaction  . Sulfonamide Derivatives Nausea And Vomiting  . Sulfasalazine Nausea And Vomiting    Physical exam:  Today's Vitals   03/04/19 0853  BP: 113/75  Pulse: (!) 56  Temp: 97.7 F (36.5 C)  Weight: 178 lb (80.7 kg)  Height: 5' 6.5" (1.689 m)   Body mass index is 28.3 kg/m.   Wt Readings from Last 3 Encounters:  03/04/19 178 lb (80.7 kg)  02/18/19 176 lb 14.4 oz (80.2 kg)  01/21/19 175 lb (79.4 kg)     Ht Readings from Last 3 Encounters:  03/04/19 5' 6.5" (1.689 m)  02/18/19 5' 6.5" (1.689 m)  01/21/19 5' 6.5" (1.689 m)      General: The patient is awake, alert and appears not in acute distress. The patient is well groomed. Head: Normocephalic, atraumatic. Neck is supple. Mallampati 2,  neck circumference:14 inches . Nasal airflow  patent.  Retrognathia is not seen.  Dental status: intact   Cardiovascular:  Regular rate and cardiac rhythm by pulse,  without distended neck veins. Respiratory: Lungs  are clear to auscultation.  Skin:  Without evidence of ankle edema, or rash. Trunk: The patient's posture is erect.   Neurologic exam : The patient is awake and alert, oriented to place and time.   Memory subjective described as intact.  Attention span & concentration ability appears normal.  Speech is fluent,  without  dysarthria, dysphonia or aphasia.  Mood and affect are appropriate.   Cranial nerves: no loss of smell or taste reported  Pupils are equal and briskly reactive to light. Funduscopic exam deferred. .  Extraocular movements in vertical and horizontal planes were intact and without nystagmus. No Diplopia. Visual fields by finger perimetry are intact. Hearing was intact to soft voice and finger rubbing.    Facial sensation intact to fine touch.  Facial motor strength is symmetric and tongue and uvula move midline.  Neck ROM : rotation, tilt and flexion extension were normal for age and shoulder shrug was symmetrical.    Motor exam:  Symmetric bulk, tone and ROM.   Normal tone without cog wheeling, symmetric grip strength .   Sensory:  Fine touch, pinprick and vibration were normal.  She has a history of ulnar nerve impairment. Proprioception tested in the upper extremities was normal.   Coordination: Rapid alternating movements in the fingers/hands were of normal speed.  The Finger-to-nose maneuver was intact without evidence of ataxia, dysmetria or tremor.   Gait and station: Patient could rise unassisted from a seated position, walked without assistive device.  Stance is of normal width/ base and the patient turned with 3 steps.  Toe and heel walk were deferred.  Deep tendon reflexes: in the  upper and lower extremities are symmetric and intact.  Babinski response was deferred.     After spending a total time of 35 minutes face to face and additional time for physical and neurologic examination, review  of laboratory studies,  personal review of imaging studies,  reports and results of other testing and review of referral information / records as far as provided in visit, I have established the following assessments:  Pulmonary hypertension can indeed bye caused by OSA and the patient is snoring, but her irregular BP spikes and tachycardia can also be caused by stress, anxiety, and manifested in Vertigo.   There was no nystagmus, no labile hypertension here- rather low BP. Vertigo can be related to high neck abnormalities, C 1,2,3 degeneration- causing impingement and/or tension neck pain.  Palpation only showed tenderness at the occipital grove.    My Plan is to proceed with:  1) I attended sleep study, but like to add a BP left arm measured at the beginning and end of the study.  2) if BCBS will not permit an attended sleep study , will order HST and ask patient to make provisions for BP measurements at home.  3)  We need reliable hypoxemia data.  Vertigo :further work up next visit- may refer to vestibular rehab.   I would like to thank Dres Doug Sou and  Shon Baton, MD [address] [place],  [place] [address] for allowing me to meet with and to take care of this pleasant patient.   In short, Cassandra Mccann is presenting with pulmonary HTN . I plan to follow up either personally or through our NP within 2 month.   CC: I will share my notes with PCP.  Electronically signed by: Larey Seat, MD 03/04/2019 9:01 AM  [facility] Board certified by The [employer] of Sleep Medicine and Diplomate of the [employer] of Sleep Medicine. Board certified In Neurology through the [facility], Fellow of the [employer] of Neurology. Medical Director of [facility].

## 2019-03-04 NOTE — Patient Instructions (Signed)
Pulmonary Hypertension Pulmonary hypertension is a long-term (chronic) condition in which there is high blood pressure in the arteries in the lungs (pulmonary arteries). This condition occurs when pulmonary arteries become narrow and tight, making it harder for blood to flow through the lungs. This in turn makes the heart work harder to pump blood through the lungs, making it harder for you to breathe. Over time, pulmonary hypertension can weaken and damage the heart muscle, specifically the right side of the heart. Pulmonary hypertension is a serious condition that can be life-threatening. What are the causes? This condition may be caused by different medical conditions. It can be categorized by cause into five groups:  Group 1: Pulmonary hypertension that is caused by abnormal growth of small blood vessels in the lungs (pulmonary arterial hypertension). The abnormal blood vessel growth may have no known cause, or it may be: ? Passed from parent to child (hereditary). ? Caused by another disease, such as a connective tissue disease (including lupus or scleroderma), congenital heart disease, liver disease, or HIV. ? Caused by certain medicines or poisons (toxins).  Group 2: Pulmonary hypertension that is caused by weakness of the left chamber of the heart (left ventricle) or heart valve disease.  Group 3: Pulmonary hypertension that is caused by lung disease or low oxygen levels. Causes in this group include: ? Emphysema or chronic obstructive pulmonary disease (COPD). ? Untreated sleep apnea. ? Pulmonary fibrosis. ? Long-term exposure to high altitudes in certain people who may already be at higher risk for pulmonary hypertension.  Group 4: Pulmonary hypertension that is caused by blood clots in the lungs (pulmonary emboli).  Group 5: Other causes of pulmonary hypertension, such as sickle cell anemia, sarcoidosis, tumors pressing on the pulmonary arteries, and various other diseases. What are  the signs or symptoms? Symptoms of this condition include:  Shortness of breath. You may notice shortness of breath with: ? Activity, such as walking. ? Minimal activity, such as getting dressed. ? No activity, like when you are sitting still.  A cough. Sometimes, bloody mucus from the lungs may be coughed up (hemoptysis).  Tiredness and fatigue.  Dizziness, lightheadedness, or fainting, especially with physical activity.  Rapid heartbeat, or feeling your heart flutter or skip a beat (palpitations).  Veins in the neck getting larger.  Swelling of the lower legs, abdomen, or both.  Bluish color of the lips and fingertips.  Chest pain or tightness in the chest.  Abdominal pain, especially in the upper abdomen. How is this diagnosed? This condition may be diagnosed based on one or more of the following tests:  Chest X-ray.  Blood tests.  CT scan.  Pulmonary function test. This test measures how much air your lungs can hold. It also tests how well air moves in and out of your lungs.  6-minute walk test. This tests how severe your condition is in relation to your activity levels.  Electrocardiogram (ECG). This test records the electrical impulses of the heart.  Echocardiogram. This test uses sound waves (ultrasound) to produce an image of the heart.  Cardiac catheterization. This is a procedure in which a thin tube (catheter) is passed into the pulmonary artery and used to test the pressure in your pulmonary artery and the right side of your heart.  Lung biopsy. This involves having a procedure to remove a small sample of lung tissue for testing. This may help determine an underlying cause of your pulmonary hypertension. How is this treated? There is no cure for   this condition, but treatment can help to relieve symptoms and slow the progress of the condition. Treatment may include:  Cardiac rehabilitation. This is a treatment program that includes exercise training,  education, and counseling to help you get stronger and return to an active lifestyle.  Oxygen therapy.  Medicines that: ? Lower blood pressure. ? Relax (dilate) the pulmonary blood vessels. ? Help the heart beat more efficiently and pump more blood. ? Help the body get rid of extra fluid (diuretics). ? Thin the blood in order to prevent blood clots in the lungs.  Lung surgery to relieve pressure on the heart, for severe cases that do not respond to medical treatment.  Heart-lung transplant, or lung transplant. This may be done in very severe cases. Follow these instructions at home: Eating and drinking   Eat a healthy diet that includes plenty of fresh fruits and vegetables, whole grains, and beans.  Limit your salt (sodium) intake to less than 2,300 mg a day. Lifestyle  Do not use any products that contain nicotine or tobacco, such as cigarettes and e-cigarettes. If you need help quitting, ask your health care provider.  Avoid secondhand smoke. Activity  Get plenty of rest.  Exercise as directed. Talk with your health care provider about what type of exercise is safe for you.  Avoid hot tubs and saunas.  Avoid high altitudes. General instructions  Take over-the-counter and prescription medicines only as told by your health care provider. Do not change or stop medicines without checking with your health care provider.  Stay up to date on your vaccines, especially yearly flu (influenza) and pneumonia vaccines.  If you are a woman of child-bearing age, avoid becoming pregnant. Talk with your health care provider about birth control.  Consider ways to get support for anxiety and stress of living with pulmonary hypertension. Talk with your health care provider about support groups and online resources.  Use oxygen therapy at home as directed.  Keep track of your weight. Weight gain could be a sign that your condition is getting worse.  Keep all follow-up visits as told by  your health care provider. This is important. Contact a health care provider if:  Your cough gets worse.  You have more shortness of breath than usual, or you start to have trouble doing activities that you could do before.  You need to use medicines or oxygen more frequently or in higher dosages than usual. Get help right away if:  You have severe shortness of breath.  You have chest pain or pressure.  You cough up blood.  You have swelling of your feet or legs that gets worse.  You have rapid weight gain over a period of 1-2 days.  Your medicines or oxygen do not provide relief. Summary  Pulmonary hypertension is a chronic condition in which there is high blood pressure in the arteries in the lungs (pulmonary arteries).  Pulmonary hypertension is a serious condition that can be life-threatening. It can be caused by a variety of illnesses.  Treatment may involve taking medicines and using oxygen therapy. Severe cases may require surgery or a transplant. This information is not intended to replace advice given to you by your health care provider. Make sure you discuss any questions you have with your health care provider. Document Released: 02/10/2007 Document Revised: 03/28/2017 Document Reviewed: 07/09/2016 Elsevier Patient Education  2020 Elsevier Inc.  

## 2019-04-04 ENCOUNTER — Ambulatory Visit (INDEPENDENT_AMBULATORY_CARE_PROVIDER_SITE_OTHER): Payer: BC Managed Care – PPO | Admitting: Neurology

## 2019-04-04 DIAGNOSIS — G4761 Periodic limb movement disorder: Secondary | ICD-10-CM | POA: Diagnosis not present

## 2019-04-04 DIAGNOSIS — I272 Pulmonary hypertension, unspecified: Secondary | ICD-10-CM

## 2019-04-04 DIAGNOSIS — K219 Gastro-esophageal reflux disease without esophagitis: Secondary | ICD-10-CM

## 2019-04-04 DIAGNOSIS — I10 Essential (primary) hypertension: Secondary | ICD-10-CM

## 2019-04-05 ENCOUNTER — Telehealth: Payer: Self-pay

## 2019-04-05 NOTE — Telephone Encounter (Signed)
Pt called and said that she is very broekn out from the monitor and has tried different creams and nothing has worked, is there anything that you can recommend?

## 2019-04-10 DIAGNOSIS — I272 Pulmonary hypertension, unspecified: Secondary | ICD-10-CM | POA: Insufficient documentation

## 2019-04-10 NOTE — Progress Notes (Signed)
There was an arousal into wakefulness at the end of every REM  sleep cycle.  Audio and video analysis did not show any abnormal or unusual  movements, behaviors, phonations or vocalizations. Soft Snoring  was noted. EKG was in keeping with normal sinus rhythm (NSR),  trending to bradycardia. Post-study, the patient indicated that  sleep was worse than usual.   IMPRESSION:   1. No clinically significant sleep disordered breathing such as  in Obstructive Sleep Apnea (OSA) was found.  2. Very mild Periodic Limb Movement Disorder.  3. Soft Snoring  4. Normal EKG   RECOMMENDATIONS: Since no evidence of sleep apnea was found ,  follow- up is optional.

## 2019-04-10 NOTE — Procedures (Signed)
PATIENT'S NAME:  Cassandra Mccann, Cassandra Mccann DOB:      07/03/1970      MR#:    [id number]     DATE OF RECORDING: 04/04/2019 REFERRING M.D.:  Adrian Prows Shon Baton, MDs Study Performed:   Baseline Polysomnogram HISTORY:  48 y.o. year old White or Caucasian female patient seen here as a referral on 03/04/2019 from Dr Einar Gip and Dr.Russo for an evaluation of possible sleep apnea in a patient with recent pulmonary hypertension, near syncope, vertigo, but no PE has been found. I have the pleasure of seeing Cassandra Mccann today, a right-handed White or Caucasian female with a possible sleep disorder.  She  has a past medical history of Allergy, Anxiety, Complication of anesthesia, Ductal carcinoma in situ of breast (2011), Endometrial hyperplasia (06/2012), GERD (gastroesophageal reflux disease), Hyperlipidemia, Hypertension, Personal history of radiation therapy (2011), PONV (postoperative nausea and vomiting), and Umbilical hernia (0000000). Dr. Almedia Balls has seen her for neck tension" crooked neck". She lost weight and improved her GERD.      Family medical /sleep history: half -brother with OSA.  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 She has felt dizzy when standing up and when seated while evaluated in the cardiologist's office. BP was stabile. She snores. She dreams vividly.  She reports not feeling refreshed or restored in AM, without dry mouth, and without morning headaches. She has bruxism.   The patient endorsed the Epworth Sleepiness Scale at 4 points.   The patient's weight 179 pounds with a height of 66 (inches), resulting in a BMI of 28.7 kg/m2. The patient's neck circumference measured 14 inches.  CURRENT MEDICATIONS: Xanax, Zyrtec, Lisinopril, Prilosec   PROCEDURE:  This is a multichannel digital polysomnogram utilizing the Somnostar 11.2 system.  Electrodes  and sensors were applied and monitored per AASM Specifications.   EEG, EOG, Chin and Limb EMG, were sampled at 200 Hz.  ECG, Snore and Nasal Pressure, Thermal Airflow, Respiratory Effort, CPAP Flow and Pressure, Oximetry was sampled at 50 Hz. Digital video and audio were recorded.      BASELINE STUDY Lights Out was at 22:39 and Lights On at 05:00.  Total recording time (TRT) was 381 minutes, with a total sleep time (TST) of 312 minutes. The patient's sleep latency was 27 minutes. REM latency was 43 minutes.  The sleep efficiency was 81.9 %.     SLEEP ARCHITECTURE: WASO (Wake after sleep onset) was 45.5 minutes.  There were 32 minutes in Stage N1, 106 minutes Stage N2, 110 minutes Stage N3 and 64 minutes in Stage REM. The percentage of Stage N1 was 10.3%, Stage N2 was 34.%, Stage N3 was 35.3% and Stage R (REM sleep) was 20.5%.     RESPIRATORY ANALYSIS:  There were a total of 9 respiratory events: 0 apneas and 9 hypopneas with a hypopnea index of 1.7 /hour.     The total APNEA/HYPOPNEA INDEX (AHI) was 1.7 /hour.  8 events occurred in REM sleep and 2 events in NREM. The REM AHI was 7.5 /hour, versus a non-REM AHI of 0.2. The patient spent 122 minutes of total sleep time in the supine position and 190 minutes in non-supine. The supine AHI was 4.4 versus a non-supine AHI of 0.0.  OXYGEN SATURATION & C02:  The Wake baseline 02 saturation was 97%, with the lowest  being 89%. Time spent below 89% saturation equaled 0 minutes.    PERIODIC LIMB MOVEMENTS:   The Periodic Limb Movement (PLM) index was 5.2/h and the PLM Arousal index was 0.8/hour. The arousals were noted as: 85 were spontaneous, 4 were associated with PLMs, 4 were associated with respiratory events.  There was an arousal into wakefulness at the end of every REM sleep cycle.  Audio and video analysis did not show any abnormal or unusual movements, behaviors, phonations or vocalizations.  Soft Snoring was noted. EKG was in keeping with normal  sinus rhythm (NSR), trending to bradycardia. Post-study, the patient indicated that sleep was worse than usual.   IMPRESSION:  1. No clinically significant sleep disordered breathing such as in Obstructive Sleep Apnea (OSA) was found. 2. Very mild Periodic Limb Movement Disorder. 3. Soft Snoring 4. Normal EKG  RECOMMENDATIONS: Since no evidence of sleep apnea was found , follow- up is optional.   I certify that I have reviewed the entire raw data recording prior to the issuance of this report in accordance with the Standards of Accreditation of the American Academy of Sleep Medicine (AASM)  Larey Seat, MD Diplomat, American Board of Psychiatry and Neurology  Diplomat, American Board of Sleep Medicine Market researcher, [place] [facility]

## 2019-04-12 DIAGNOSIS — I10 Essential (primary) hypertension: Secondary | ICD-10-CM | POA: Diagnosis not present

## 2019-04-12 DIAGNOSIS — L298 Other pruritus: Secondary | ICD-10-CM | POA: Diagnosis not present

## 2019-04-12 DIAGNOSIS — L259 Unspecified contact dermatitis, unspecified cause: Secondary | ICD-10-CM | POA: Diagnosis not present

## 2019-04-13 ENCOUNTER — Telehealth: Payer: Self-pay | Admitting: Neurology

## 2019-04-13 NOTE — Telephone Encounter (Signed)
Called patient to discuss sleep study results. No answer at this time. LVM for the patient to call back.   

## 2019-04-13 NOTE — Telephone Encounter (Signed)
Pt returned call and I was able to review her sleep study with her. Informed that sleep study was negative for sleep disorder and her heart rate and oxygen level stayed in good range. There was nothing of clinical concern from this sleep study. Pt verbalized understanding. Pt had no questions at this time but was encouraged to call back if questions arise.

## 2019-04-13 NOTE — Telephone Encounter (Signed)
-----   Message from Larey Seat, MD sent at 04/10/2019  5:38 PM EST ----- There was an arousal into wakefulness at the end of every REM  sleep cycle.  Audio and video analysis did not show any abnormal or unusual  movements, behaviors, phonations or vocalizations. Soft Snoring  was noted. EKG was in keeping with normal sinus rhythm (NSR),  trending to bradycardia. Post-study, the patient indicated that  sleep was worse than usual.   IMPRESSION:   1. No clinically significant sleep disordered breathing such as  in Obstructive Sleep Apnea (OSA) was found.  2. Very mild Periodic Limb Movement Disorder.  3. Soft Snoring  4. Normal EKG   RECOMMENDATIONS: Since no evidence of sleep apnea was found ,  follow- up is optional.

## 2019-05-21 DIAGNOSIS — U071 COVID-19: Secondary | ICD-10-CM | POA: Diagnosis not present

## 2019-05-21 DIAGNOSIS — Z03818 Encounter for observation for suspected exposure to other biological agents ruled out: Secondary | ICD-10-CM | POA: Diagnosis not present

## 2019-05-21 DIAGNOSIS — Z20822 Contact with and (suspected) exposure to covid-19: Secondary | ICD-10-CM | POA: Diagnosis not present

## 2019-05-24 DIAGNOSIS — U071 COVID-19: Secondary | ICD-10-CM | POA: Diagnosis not present

## 2019-07-15 ENCOUNTER — Other Ambulatory Visit: Payer: Self-pay

## 2019-07-15 ENCOUNTER — Ambulatory Visit: Payer: BC Managed Care – PPO

## 2019-07-15 DIAGNOSIS — I272 Pulmonary hypertension, unspecified: Secondary | ICD-10-CM

## 2019-07-19 DIAGNOSIS — Z01419 Encounter for gynecological examination (general) (routine) without abnormal findings: Secondary | ICD-10-CM | POA: Diagnosis not present

## 2019-07-19 DIAGNOSIS — F419 Anxiety disorder, unspecified: Secondary | ICD-10-CM | POA: Diagnosis not present

## 2019-07-19 DIAGNOSIS — Z6831 Body mass index (BMI) 31.0-31.9, adult: Secondary | ICD-10-CM | POA: Diagnosis not present

## 2019-07-29 ENCOUNTER — Other Ambulatory Visit: Payer: BC Managed Care – PPO

## 2019-07-29 DIAGNOSIS — E7849 Other hyperlipidemia: Secondary | ICD-10-CM | POA: Diagnosis not present

## 2019-07-29 DIAGNOSIS — Z0000 Encounter for general adult medical examination without abnormal findings: Secondary | ICD-10-CM | POA: Diagnosis not present

## 2019-07-30 ENCOUNTER — Other Ambulatory Visit: Payer: BC Managed Care – PPO

## 2019-08-06 DIAGNOSIS — I272 Pulmonary hypertension, unspecified: Secondary | ICD-10-CM | POA: Diagnosis not present

## 2019-08-06 DIAGNOSIS — Z0000 Encounter for general adult medical examination without abnormal findings: Secondary | ICD-10-CM | POA: Diagnosis not present

## 2019-08-06 DIAGNOSIS — E669 Obesity, unspecified: Secondary | ICD-10-CM | POA: Diagnosis not present

## 2019-08-06 DIAGNOSIS — F418 Other specified anxiety disorders: Secondary | ICD-10-CM | POA: Diagnosis not present

## 2019-08-06 DIAGNOSIS — Z853 Personal history of malignant neoplasm of breast: Secondary | ICD-10-CM | POA: Diagnosis not present

## 2019-08-12 DIAGNOSIS — I272 Pulmonary hypertension, unspecified: Secondary | ICD-10-CM | POA: Insufficient documentation

## 2019-08-12 NOTE — Progress Notes (Signed)
Follow up visit  Subjective:   Cassandra Mccann, female    DOB: 06-Mar-1971, 49 y.o.   MRN: [id number]   HPI  Chief Complaint  Patient presents with  . Hypertension  . Follow-up    6 month  . Results    echo    49 y.o. Caucasian female with mild PH, mild MR, mild TR.  Given patients mildly elevated PASP 35-40 mmHg, she underwent sleep study and VQ scan-both were normal. Patient is doing well. She is exercising on treadmill and elliptical regularly, without any shortness of breath   Current Outpatient Medications on File Prior to Visit  Medication Sig Dispense Refill  . ALPRAZolam (XANAX) 0.25 MG tablet Take 1 tablet (0.25 mg total) by mouth daily as needed for anxiety. 30 tablet 0  . cetirizine (ZYRTEC) 10 MG tablet TAKE 1 TABLET(10 MG) BY MOUTH DAILY 30 tablet 0  . lisinopril (PRINIVIL,ZESTRIL) 10 MG tablet Take 0.5 tablets (5 mg total) by mouth daily. 45 tablet 0  . omeprazole (PRILOSEC) 10 MG capsule Take 10 mg by mouth daily.     No current facility-administered medications on file prior to visit.    Cardiovascular & other pertient studies:  EKG 08/13/2019: Sinus bradycardia 49 bpm.  Echocardiogram 07/15/2019:  Normal LV systolic function with visual EF 60-65%. Left ventricle cavity  is normal in size. Normal wall thickness. Normal global wall motion. No  obvious regional wall motion abnormalities. Calculated EF 67%.  Left atrial cavity is mildly dilated.  Mild mitral regurgitation.  Mild tricuspid regurgitation. Mild pulmonary hypertension. RVSP measures  82mHg.  IVC is dilated with a respiratory response of >50%.  Compared to prior study dated 01/2019: LV function is stable, MR is now  mild from moderate, TR is now mild. mild pulmonary hypertension per RVSP.   30-day monitor 9/24-10/23/2020:  Normal sinus rhythm.  10 patient triggered events occurred for chest pain, flutter, lightheadedness that correlated with sinus rhythm.  Minimum heart rate 40 bpm and  maximum heart rate 126 bpm.  No atrial fibrillation or SVT was noted.  VQ scan 02/26/2019: Normal perfusion study.  No evidence of pulmonary embolus.  Recent labs: 07/29/2019: Glucose 94, BUN/Cr 12/0.7. EGFR 88 Chol 213, TG 72, HDL 72, LDL 127 TSH 1.1 normal    Review of Systems  Cardiovascular: Negative for chest pain, dyspnea on exertion, leg swelling, palpitations and syncope.         Vitals:   08/13/19 1043  BP: 121/72  Pulse: (!) 55  Resp: 17  Temp: 97.9 F (36.6 C)  SpO2: 100%    Body mass index is 30.65 kg/m. Filed Weights   08/13/19 1043  Weight: 189 lb 14.4 oz (86.1 kg)     Objective:   Physical Exam  Constitutional: She appears well-developed and well-nourished.  Neck: No JVD present.  Cardiovascular: Normal rate, regular rhythm and intact distal pulses.  Murmur heard. High-pitched blowing holosystolic murmur is present with a grade of 2/6 at the apex. Pulmonary/Chest: Effort normal and breath sounds normal. She has no wheezes. She has no rales.  Musculoskeletal:        General: No edema.  Nursing note and vitals reviewed.         Assessment & Recommendations:   49y.o. Caucasian female with mild PH, mild MR, mild TR.  Minimally elevated PASP on echocardiogram, without any symptoms of shortness of breath. This is unlikely to be of clinical significance. Continue regular physical exercise. Repeat echocardiogram and follow up  in 1 year.    Nigel Mormon, MD [facility] Pager: [phone] Office: [phone]

## 2019-08-13 ENCOUNTER — Ambulatory Visit: Payer: BC Managed Care – PPO | Admitting: Cardiology

## 2019-08-13 ENCOUNTER — Other Ambulatory Visit: Payer: Self-pay

## 2019-08-13 ENCOUNTER — Encounter: Payer: Self-pay | Admitting: Cardiology

## 2019-08-13 VITALS — BP 121/72 | HR 55 | Temp 97.9°F | Resp 17 | Ht 66.0 in | Wt 189.9 lb

## 2019-08-13 DIAGNOSIS — I340 Nonrheumatic mitral (valve) insufficiency: Secondary | ICD-10-CM | POA: Diagnosis not present

## 2019-08-13 DIAGNOSIS — I361 Nonrheumatic tricuspid (valve) insufficiency: Secondary | ICD-10-CM | POA: Diagnosis not present

## 2019-08-13 DIAGNOSIS — I272 Pulmonary hypertension, unspecified: Secondary | ICD-10-CM

## 2019-08-18 ENCOUNTER — Other Ambulatory Visit: Payer: Self-pay | Admitting: Internal Medicine

## 2019-08-18 DIAGNOSIS — Z1231 Encounter for screening mammogram for malignant neoplasm of breast: Secondary | ICD-10-CM

## 2019-08-27 ENCOUNTER — Ambulatory Visit: Payer: BC Managed Care – PPO | Admitting: Cardiology

## 2019-09-03 ENCOUNTER — Other Ambulatory Visit: Payer: Self-pay

## 2019-09-03 ENCOUNTER — Ambulatory Visit
Admission: RE | Admit: 2019-09-03 | Discharge: 2019-09-03 | Disposition: A | Discharge status: Home / Self Care | Payer: BC Managed Care – PPO | Source: Ambulatory Visit | Attending: Internal Medicine | Admitting: Internal Medicine

## 2019-09-03 DIAGNOSIS — Z1231 Encounter for screening mammogram for malignant neoplasm of breast: Secondary | ICD-10-CM | POA: Diagnosis not present

## 2019-09-03 DIAGNOSIS — M25531 Pain in right wrist: Secondary | ICD-10-CM | POA: Diagnosis not present

## 2019-09-03 DIAGNOSIS — M65831 Other synovitis and tenosynovitis, right forearm: Secondary | ICD-10-CM | POA: Diagnosis not present

## 2019-12-02 IMAGING — DX DG CHEST 2V
2 series · 2 of 2 positions shown · non-contrast
Comparison: 12/30/2017

CLINICAL DATA: Pulmonary hypertension.  For comparison to VQ scan.

EXAM:
CHEST - 2 VIEW

[w chest pa]
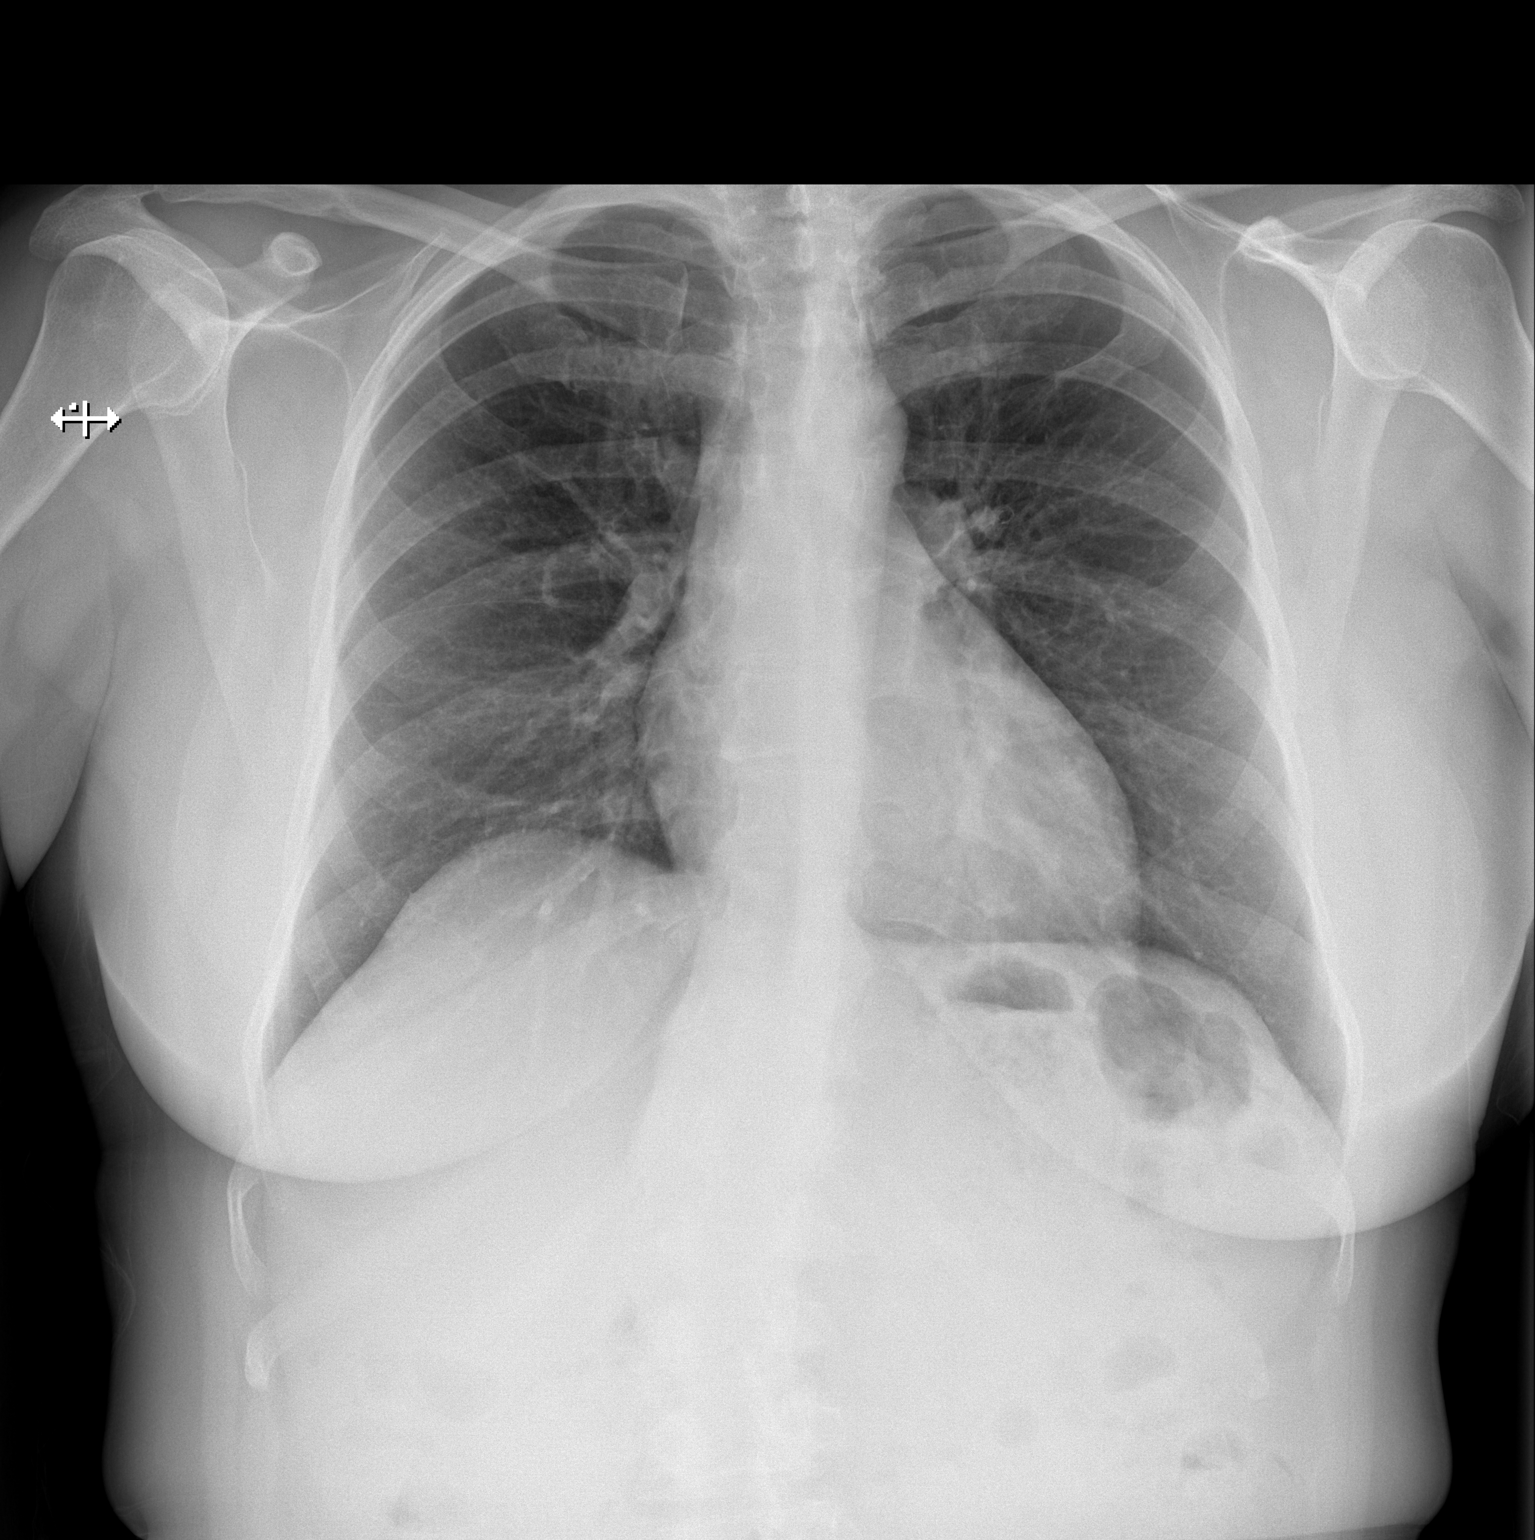

[w chest lat]
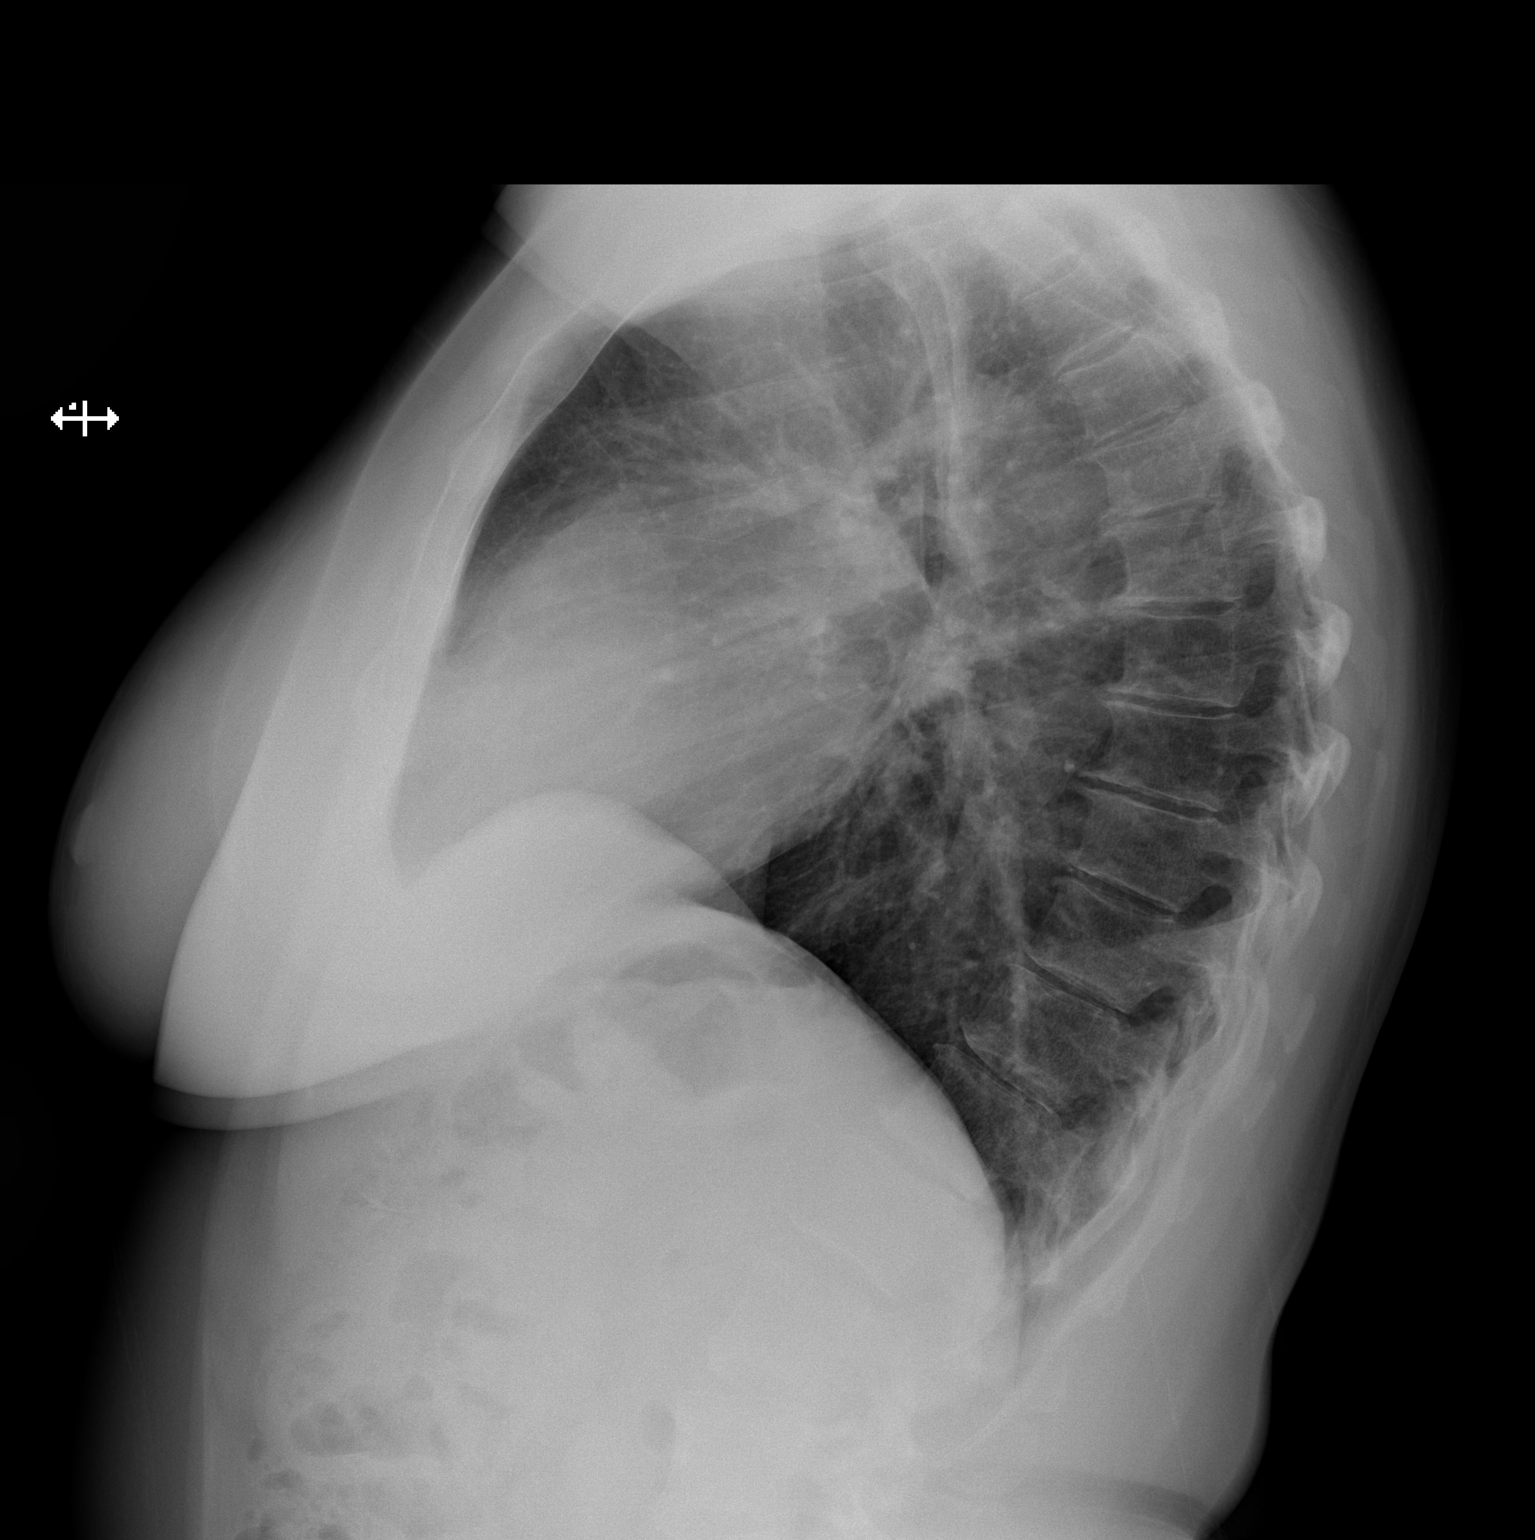

[2 of 2 positions shown; findings below may reference images not displayed]

FINDINGS: Heart and mediastinal contours are within normal limits. No focal
opacities or effusions. No acute bony abnormality.
IMPRESSION: No active cardiopulmonary disease.

## 2019-12-02 IMAGING — NM NM PULMONARY PERF PARTICULATE
8 series · 8 of 8 positions shown · non-contrast
Comparison: Chest x-ray today

CLINICAL DATA: Pulmonary hypertension

EXAM:
NUCLEAR MEDICINE PERFUSION LUNG SCAN
TECHNIQUE: Perfusion images were obtained in multiple projections after
intravenous injection of radiopharmaceutical.
Ventilation scans intentionally deferred if perfusion scan and chest
x-ray adequate for interpretation during COVID 19 epidemic.
RADIOPHARMACEUTICALS:  1.5 mCi Ec-VVm MAA IV

[Series 1: ant/post perf · 4.14mm/px · 1 of 1 slices shown (1 of 2)]
[im 1/1]
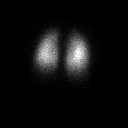

[Series 1: ant/post perf · 4.14mm/px · 1 of 1 slices shown (2 of 2)]
[im 1/1]
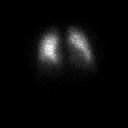

[Series 2: lao/rpo perf · 4.14mm/px · 1 of 1 slices shown (1 of 2)]
[im 1/1]
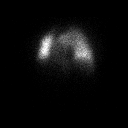

[Series 2: lao/rpo perf · 4.14mm/px · 1 of 1 slices shown (2 of 2)]
[im 1/1]
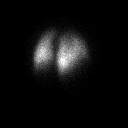

[Series 3: lpo/rao perf · 4.14mm/px · 1 of 1 slices shown (1 of 2)]
[im 1/1]
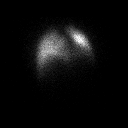

[Series 3: lpo/rao perf · 4.14mm/px · 1 of 1 slices shown (2 of 2)]
[im 1/1]
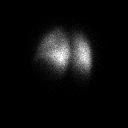

[Series 4: lt lat/rt lat perf · 4.14mm/px · 1 of 1 slices shown (1 of 2)]
[im 1/1]
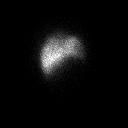

[Series 4: lt lat/rt lat perf · 4.14mm/px · 1 of 1 slices shown (2 of 2)]
[im 1/1]
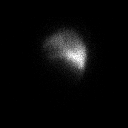

[8 of 8 positions shown; findings below may reference images not displayed]

FINDINGS: No areas of decreased perfusion to suggest pulmonary embolus. Due to
normal appearance of the perfusion, ventilation was not performed.
IMPRESSION: Normal perfusion study.  No evidence of pulmonary embolus.

## 2020-01-24 DIAGNOSIS — J069 Acute upper respiratory infection, unspecified: Secondary | ICD-10-CM | POA: Diagnosis not present

## 2020-01-24 DIAGNOSIS — Z20822 Contact with and (suspected) exposure to covid-19: Secondary | ICD-10-CM | POA: Diagnosis not present

## 2020-01-24 DIAGNOSIS — Z7189 Other specified counseling: Secondary | ICD-10-CM | POA: Diagnosis not present

## 2020-07-07 ENCOUNTER — Other Ambulatory Visit: Payer: Self-pay | Admitting: Internal Medicine

## 2020-07-07 DIAGNOSIS — Z1231 Encounter for screening mammogram for malignant neoplasm of breast: Secondary | ICD-10-CM

## 2020-07-28 ENCOUNTER — Other Ambulatory Visit: Payer: Self-pay

## 2020-07-28 ENCOUNTER — Ambulatory Visit: Payer: BC Managed Care – PPO

## 2020-07-28 DIAGNOSIS — I340 Nonrheumatic mitral (valve) insufficiency: Secondary | ICD-10-CM

## 2020-07-28 DIAGNOSIS — I361 Nonrheumatic tricuspid (valve) insufficiency: Secondary | ICD-10-CM | POA: Diagnosis not present

## 2020-08-11 ENCOUNTER — Ambulatory Visit: Payer: BC Managed Care – PPO | Admitting: Cardiology

## 2020-08-18 ENCOUNTER — Ambulatory Visit: Payer: BC Managed Care – PPO | Admitting: Cardiology

## 2020-09-01 DIAGNOSIS — E785 Hyperlipidemia, unspecified: Secondary | ICD-10-CM | POA: Diagnosis not present

## 2020-09-08 ENCOUNTER — Ambulatory Visit
Admission: RE | Admit: 2020-09-08 | Discharge: 2020-09-08 | Disposition: A | Discharge status: Home / Self Care | Payer: BC Managed Care – PPO | Source: Ambulatory Visit | Attending: Internal Medicine | Admitting: Internal Medicine

## 2020-09-08 ENCOUNTER — Other Ambulatory Visit: Payer: Self-pay

## 2020-09-08 DIAGNOSIS — Z0000 Encounter for general adult medical examination without abnormal findings: Secondary | ICD-10-CM | POA: Diagnosis not present

## 2020-09-08 DIAGNOSIS — Z1231 Encounter for screening mammogram for malignant neoplasm of breast: Secondary | ICD-10-CM | POA: Diagnosis not present

## 2020-09-08 DIAGNOSIS — I10 Essential (primary) hypertension: Secondary | ICD-10-CM | POA: Diagnosis not present

## 2020-09-18 ENCOUNTER — Ambulatory Visit: Payer: BC Managed Care – PPO | Admitting: Cardiology

## 2020-09-18 ENCOUNTER — Encounter: Payer: Self-pay | Admitting: Cardiology

## 2020-09-18 ENCOUNTER — Other Ambulatory Visit: Payer: Self-pay

## 2020-09-18 VITALS — BP 118/79 | HR 58 | Temp 98.5°F | Resp 16 | Ht 66.0 in | Wt 201.0 lb

## 2020-09-18 DIAGNOSIS — F419 Anxiety disorder, unspecified: Secondary | ICD-10-CM | POA: Diagnosis not present

## 2020-09-18 DIAGNOSIS — Z01419 Encounter for gynecological examination (general) (routine) without abnormal findings: Secondary | ICD-10-CM | POA: Diagnosis not present

## 2020-09-18 DIAGNOSIS — I340 Nonrheumatic mitral (valve) insufficiency: Secondary | ICD-10-CM

## 2020-09-18 DIAGNOSIS — Z6832 Body mass index (BMI) 32.0-32.9, adult: Secondary | ICD-10-CM | POA: Diagnosis not present

## 2020-09-18 NOTE — Progress Notes (Signed)
Follow up visit  Subjective:   Cassandra Mccann, female    DOB: 03-18-1971, 50 y.o.   MRN: [id number]   HPI  Chief Complaint  Patient presents with  . Follow-up    1 year  . Mitral Regurgitation  . Pulmonary hypertension    50 y.o. Caucasian female with mild PH, mild MR, mild TR.  Given patients mildly elevated PASP 35-40 mmHg, she underwent sleep study and VQ scan-both were normal. Patient denies chest pain, shortness of breath, palpitations, leg edema, orthopnea, PND, TIA/syncope.   Current Outpatient Medications on File Prior to Visit  Medication Sig Dispense Refill  . ALPRAZolam (XANAX) 0.25 MG tablet Take 1 tablet (0.25 mg total) by mouth daily as needed for anxiety. 30 tablet 0  . cetirizine (ZYRTEC) 10 MG tablet TAKE 1 TABLET(10 MG) BY MOUTH DAILY 30 tablet 0  . lisinopril (PRINIVIL,ZESTRIL) 10 MG tablet Take 0.5 tablets (5 mg total) by mouth daily. 45 tablet 0  . omeprazole (PRILOSEC) 10 MG capsule Take 10 mg by mouth daily.     No current facility-administered medications on file prior to visit.    Cardiovascular & other pertient studies:  Echocardiogram 07/28/2020:  Left ventricle cavity is normal in size and wall thickness. Normal global  wall motion. Normal LV systolic function with EF 65%. Normal diastolic  filling pattern.  Left atrial cavity is mildly dilated.  Mild to moderate mitral regurgitation.  Mild to moderate tricuspid regurgitation. Mild pulmonary hypertension.  Estimated pulmonary artery systolic pressure 39 mmHg.  No significant change compared to previous study on 07/15/2019.   EKG 09/18/2020: Sinus rhythm 73 bpm Low voltage in precordial leads  30-day monitor 9/24-10/23/2020:  Normal sinus rhythm.  10 patient triggered events occurred for chest pain, flutter, lightheadedness that correlated with sinus rhythm.  Minimum heart rate 40 bpm and maximum heart rate 126 bpm.  No atrial fibrillation or SVT was noted.  VQ scan 02/26/2019: Normal  perfusion study.  No evidence of pulmonary embolus.  Recent labs: 07/29/2019: Glucose 94, BUN/Cr 12/0.7. EGFR 88 Chol 213, TG 72, HDL 72, LDL 127 TSH 1.1 normal    Review of Systems  Cardiovascular: Negative for chest pain, dyspnea on exertion, leg swelling, palpitations and syncope.         Vitals:   09/18/20 1310  BP: 118/79  Pulse: (!) 58  Resp: 16  Temp: 98.5 F (36.9 C)  SpO2: 98%    Body mass index is 32.44 kg/m. Filed Weights   09/18/20 1310  Weight: 201 lb (91.2 kg)     Objective:   Physical Exam Vitals and nursing note reviewed.  Constitutional:      Appearance: She is well-developed.  Neck:     Vascular: No JVD.  Cardiovascular:     Rate and Rhythm: Normal rate and regular rhythm.     Pulses: Intact distal pulses.     Heart sounds: Murmur heard.  High-pitched blowing holosystolic murmur is present with a grade of 2/6 at the apex.   Pulmonary:     Effort: Pulmonary effort is normal.     Breath sounds: Normal breath sounds. No wheezing or rales.         Assessment & Recommendations:   50 y.o. Caucasian female with mild MR, mild TR.  Minimally elevated PASP on echocardiogram, without any symptoms of shortness of breath. This is unlikely to be of clinical significance. Continue regular physical exercise.  No f/u echocardiogram necessary, unless new symptoms of dyspnea, leg edema  etc develop.  F/u as needed   Nigel Mormon, MD [facility] Pager: [phone] Office: [phone]

## 2020-09-22 DIAGNOSIS — R82998 Other abnormal findings in urine: Secondary | ICD-10-CM | POA: Diagnosis not present

## 2020-09-22 DIAGNOSIS — Z1212 Encounter for screening for malignant neoplasm of rectum: Secondary | ICD-10-CM | POA: Diagnosis not present

## 2020-09-22 DIAGNOSIS — I10 Essential (primary) hypertension: Secondary | ICD-10-CM | POA: Diagnosis not present

## 2020-11-29 ENCOUNTER — Ambulatory Visit: Payer: BC Managed Care – PPO | Admitting: Cardiology

## 2020-11-29 ENCOUNTER — Other Ambulatory Visit: Payer: Self-pay

## 2020-11-29 ENCOUNTER — Encounter: Payer: Self-pay | Admitting: Cardiology

## 2020-11-29 VITALS — BP 135/82 | HR 66 | Temp 97.8°F | Ht 66.5 in | Wt 203.0 lb

## 2020-11-29 DIAGNOSIS — R072 Precordial pain: Secondary | ICD-10-CM | POA: Diagnosis not present

## 2020-11-29 MED ORDER — NITROGLYCERIN 0.4 MG SL SUBL: 0.4000 mg | SUBLINGUAL_TABLET | SUBLINGUAL | 1 refills | Status: AC | PRN

## 2020-11-29 NOTE — Progress Notes (Signed)
Follow up visit  Subjective:   Sheilah Pigeon, female    DOB: 1970-08-27, 50 y.o.   MRN: [id number]   HPI  Chief Complaint  Patient presents with   Chest Pain   Follow-up    50 y.o. Caucasian female with mild PH, mild MR, mild TR.  Patient woke up this morning with retrosternal chest pain.  Pain was focal, nonradiating, unrelated to exertion, resolved on its own in 15 minutes.  She made this appointment for further evaluation of the chest pain.  Patient underwent CT cardiac scoring in 11/2018 that showed calcium score of 0.  Current Outpatient Medications on File Prior to Visit  Medication Sig Dispense Refill   ALPRAZolam (XANAX) 0.25 MG tablet Take 1 tablet (0.25 mg total) by mouth daily as needed for anxiety. 30 tablet 0   cetirizine (ZYRTEC) 10 MG tablet TAKE 1 TABLET(10 MG) BY MOUTH DAILY 30 tablet 0   lisinopril (PRINIVIL,ZESTRIL) 10 MG tablet Take 0.5 tablets (5 mg total) by mouth daily. 45 tablet 0   omeprazole (PRILOSEC) 10 MG capsule Take 10 mg by mouth daily.     No current facility-administered medications on file prior to visit.    Cardiovascular & other pertient studies:  EKG 11/29/2020: Sinus rhythm 58 bpm Occasional PAC    Low voltage in precordial leads No significant ST-T abnormalities  Echocardiogram 07/28/2020:  Left ventricle cavity is normal in size and wall thickness. Normal global  wall motion. Normal LV systolic function with EF 65%. Normal diastolic  filling pattern.  Left atrial cavity is mildly dilated.  Mild to moderate mitral regurgitation.  Mild to moderate tricuspid regurgitation. Mild pulmonary hypertension.  Estimated pulmonary artery systolic pressure 39 mmHg.  No significant change compared to previous study on 07/15/2019.   EKG 09/18/2020: Sinus rhythm 73 bpm Low voltage in precordial leads  30-day monitor 9/24-10/23/2020:  Normal sinus rhythm.  10 patient triggered events occurred for chest pain, flutter, lightheadedness that  correlated with sinus rhythm.  Minimum heart rate 40 bpm and maximum heart rate 126 bpm.  No atrial fibrillation or SVT was noted.  VQ scan 02/26/2019: Normal perfusion study.  No evidence of pulmonary embolus.   Recent labs: 07/29/2019: Glucose 94, BUN/Cr 12/0.7. EGFR 88 Chol 213, TG 72, HDL 72, LDL 127 TSH 1.1 normal    Review of Systems  Cardiovascular:  Positive for chest pain. Negative for dyspnea on exertion, leg swelling, palpitations and syncope.        Vitals:   11/29/20 1054  BP: 135/82  Pulse: 66  Temp: 97.8 F (36.6 C)  SpO2: 100%    Body mass index is 32.27 kg/m. Filed Weights   11/29/20 1054  Weight: 203 lb (92.1 kg)     Objective:   Physical Exam Vitals and nursing note reviewed.  Constitutional:      General: She is not in acute distress. Neck:     Vascular: No JVD.  Cardiovascular:     Rate and Rhythm: Normal rate and regular rhythm.     Heart sounds: Normal heart sounds. No murmur heard. Pulmonary:     Effort: Pulmonary effort is normal.     Breath sounds: Normal breath sounds. No wheezing or rales.  Chest:     Comments: Reproducible chest wall tenderness Musculoskeletal:     Right lower leg: No edema.     Left lower leg: No edema.        Assessment & Recommendations:   50 y.o. Caucasian female with  mild MR, mild TR, atypical chest pain  Atypical chest pain: EKG with no ischemia, only occasional PAC.  Chest pain most likely nonanginal.  CT cardiac scoring 11/2018 showed calcium score of 0.  I will obtain exercise treadmill stress test.  I will check troponins today to rule out ACS, clinical suspicion is very low. SL NTG prescribed for as needed use.   F/u as needed   Nigel Mormon, MD [facility] Pager: [phone] Office: [phone]

## 2020-11-30 LAB — TROPONIN T: Troponin T (Highly Sensitive): 9 ng/L (ref 0–14)

## 2020-12-23 DIAGNOSIS — Z1212 Encounter for screening for malignant neoplasm of rectum: Secondary | ICD-10-CM | POA: Diagnosis not present

## 2021-01-12 ENCOUNTER — Ambulatory Visit: Payer: BC Managed Care – PPO

## 2021-01-12 ENCOUNTER — Other Ambulatory Visit: Payer: Self-pay

## 2021-01-12 DIAGNOSIS — R072 Precordial pain: Secondary | ICD-10-CM

## 2021-01-13 LAB — PCV CARDIAC STRESS TEST
Angina Index: 0
ST Depression (mm): 0 mm

## 2021-03-07 DIAGNOSIS — N8110 Cystocele, unspecified: Secondary | ICD-10-CM | POA: Diagnosis not present

## 2021-03-07 DIAGNOSIS — R1030 Lower abdominal pain, unspecified: Secondary | ICD-10-CM | POA: Diagnosis not present

## 2021-03-08 DIAGNOSIS — R102 Pelvic and perineal pain: Secondary | ICD-10-CM | POA: Diagnosis not present

## 2021-04-25 DIAGNOSIS — N8111 Cystocele, midline: Secondary | ICD-10-CM | POA: Diagnosis not present

## 2021-04-25 DIAGNOSIS — R3915 Urgency of urination: Secondary | ICD-10-CM | POA: Diagnosis not present

## 2021-04-25 DIAGNOSIS — N393 Stress incontinence (female) (male): Secondary | ICD-10-CM | POA: Diagnosis not present

## 2021-05-18 DIAGNOSIS — M6281 Muscle weakness (generalized): Secondary | ICD-10-CM | POA: Diagnosis not present

## 2021-05-18 DIAGNOSIS — M6289 Other specified disorders of muscle: Secondary | ICD-10-CM | POA: Diagnosis not present

## 2021-05-18 DIAGNOSIS — N393 Stress incontinence (female) (male): Secondary | ICD-10-CM | POA: Diagnosis not present

## 2021-06-01 DIAGNOSIS — R3915 Urgency of urination: Secondary | ICD-10-CM | POA: Diagnosis not present

## 2021-06-01 DIAGNOSIS — M6281 Muscle weakness (generalized): Secondary | ICD-10-CM | POA: Diagnosis not present

## 2021-06-01 DIAGNOSIS — M6289 Other specified disorders of muscle: Secondary | ICD-10-CM | POA: Diagnosis not present

## 2021-06-01 DIAGNOSIS — N393 Stress incontinence (female) (male): Secondary | ICD-10-CM | POA: Diagnosis not present

## 2021-07-27 DIAGNOSIS — N393 Stress incontinence (female) (male): Secondary | ICD-10-CM | POA: Diagnosis not present

## 2021-07-27 DIAGNOSIS — R3915 Urgency of urination: Secondary | ICD-10-CM | POA: Diagnosis not present

## 2021-07-27 DIAGNOSIS — N8111 Cystocele, midline: Secondary | ICD-10-CM | POA: Diagnosis not present

## 2021-08-21 ENCOUNTER — Other Ambulatory Visit: Payer: Self-pay | Admitting: Obstetrics and Gynecology

## 2021-08-21 DIAGNOSIS — Z1231 Encounter for screening mammogram for malignant neoplasm of breast: Secondary | ICD-10-CM

## 2021-09-13 ENCOUNTER — Ambulatory Visit
Admission: RE | Admit: 2021-09-13 | Discharge: 2021-09-13 | Disposition: A | Discharge status: Home / Self Care | Payer: BC Managed Care – PPO | Source: Ambulatory Visit | Attending: Obstetrics and Gynecology | Admitting: Obstetrics and Gynecology

## 2021-09-13 DIAGNOSIS — Z1231 Encounter for screening mammogram for malignant neoplasm of breast: Secondary | ICD-10-CM

## 2021-09-14 ENCOUNTER — Ambulatory Visit: Payer: BC Managed Care – PPO

## 2021-09-21 DIAGNOSIS — E785 Hyperlipidemia, unspecified: Secondary | ICD-10-CM | POA: Diagnosis not present

## 2021-09-28 DIAGNOSIS — N8111 Cystocele, midline: Secondary | ICD-10-CM | POA: Diagnosis not present

## 2021-09-28 DIAGNOSIS — Z0000 Encounter for general adult medical examination without abnormal findings: Secondary | ICD-10-CM | POA: Diagnosis not present

## 2021-09-28 DIAGNOSIS — R102 Pelvic and perineal pain: Secondary | ICD-10-CM | POA: Diagnosis not present

## 2021-09-28 DIAGNOSIS — I272 Pulmonary hypertension, unspecified: Secondary | ICD-10-CM | POA: Diagnosis not present

## 2021-09-28 DIAGNOSIS — I10 Essential (primary) hypertension: Secondary | ICD-10-CM | POA: Diagnosis not present

## 2021-09-28 DIAGNOSIS — Z1331 Encounter for screening for depression: Secondary | ICD-10-CM | POA: Diagnosis not present

## 2021-09-28 DIAGNOSIS — Z1339 Encounter for screening examination for other mental health and behavioral disorders: Secondary | ICD-10-CM | POA: Diagnosis not present

## 2021-09-28 DIAGNOSIS — R82998 Other abnormal findings in urine: Secondary | ICD-10-CM | POA: Diagnosis not present

## 2021-10-12 DIAGNOSIS — N281 Cyst of kidney, acquired: Secondary | ICD-10-CM | POA: Diagnosis not present

## 2021-10-12 DIAGNOSIS — R1032 Left lower quadrant pain: Secondary | ICD-10-CM | POA: Diagnosis not present

## 2021-10-22 DIAGNOSIS — N951 Menopausal and female climacteric states: Secondary | ICD-10-CM | POA: Diagnosis not present

## 2021-10-22 DIAGNOSIS — Z6828 Body mass index (BMI) 28.0-28.9, adult: Secondary | ICD-10-CM | POA: Diagnosis not present

## 2021-10-22 DIAGNOSIS — Z01419 Encounter for gynecological examination (general) (routine) without abnormal findings: Secondary | ICD-10-CM | POA: Diagnosis not present

## 2021-12-04 ENCOUNTER — Encounter: Payer: Self-pay | Admitting: Gastroenterology

## 2021-12-18 ENCOUNTER — Encounter: Payer: Self-pay | Admitting: Gastroenterology

## 2021-12-21 DIAGNOSIS — M25521 Pain in right elbow: Secondary | ICD-10-CM | POA: Diagnosis not present

## 2022-01-25 DIAGNOSIS — L259 Unspecified contact dermatitis, unspecified cause: Secondary | ICD-10-CM | POA: Diagnosis not present

## 2022-02-25 ENCOUNTER — Encounter: Payer: Self-pay | Admitting: Gastroenterology

## 2022-02-25 ENCOUNTER — Ambulatory Visit: Payer: BC Managed Care – PPO | Admitting: Gastroenterology

## 2022-02-25 VITALS — BP 110/80 | HR 72 | Ht 66.5 in | Wt 183.4 lb

## 2022-02-25 DIAGNOSIS — K219 Gastro-esophageal reflux disease without esophagitis: Secondary | ICD-10-CM

## 2022-02-25 DIAGNOSIS — R1030 Lower abdominal pain, unspecified: Secondary | ICD-10-CM | POA: Diagnosis not present

## 2022-02-25 DIAGNOSIS — Z86010 Personal history of colonic polyps: Secondary | ICD-10-CM

## 2022-02-25 MED ORDER — SUTAB 1479-225-188 MG PO TABS: 1.0000 | ORAL_TABLET | Freq: Once | ORAL | 0 refills | Status: AC

## 2022-02-25 NOTE — Patient Instructions (Addendum)
If you are age 53 or older, your body mass index should be between 23-30. Your Body mass index is 29.15 kg/m. If this is out of the aforementioned range listed, please consider follow up with your Primary Care Provider.  If you are age 46 or younger, your body mass index should be between 19-25. Your Body mass index is 29.15 kg/m. If this is out of the aformentioned range listed, please consider follow up with your Primary Care Provider.   ________________________________________________________   [name] have been scheduled for a colonoscopy. Please follow written instructions given to you at your visit today.  Please pick up your prep supplies at the pharmacy within the next 1-3 days. If you use inhalers (even only as needed), please bring them with you on the day of your procedure.  We will request your records from Dr. Virgina Jock and Dr. Claudia Desanctis  Thank you for entrusting me with your care and for choosing [facility], Dr. Vilas Cellar

## 2022-02-25 NOTE — Progress Notes (Signed)
HPI :  51 year old female here to reestablish care, for abdominal pain, history of colon polyps.  She also has a history of GERD.  Recall I saw her last in July 2018.  She is here today for further evaluation of lower abdominal/pelvic pain.  She states she initially had pain in her lower abdomen/pelvis, below her umbilicus and midline.  She had been seen by her primary care and gynecology, had tenderness along her bladder on pelvic exam, was thought she may have had a bladder infection but testing was negative for that.  She was quite tender to the area, she states she "felt every bump in the road" when she had the pain it was very tender.  She also had pain with intercourse.  She was seen by urology, states she had a CT scan done and was negative although we have no report of that today.  The symptoms went away on their own after about a week, and then came back over time.  She was seen by urology again and had a cystoscopy in the setting of pain which was normal.  She states she had normal labs with Dr. Virgina Jock.  She was told to consider coming back having another colonoscopy given her polyp history.  She states she had some discomfort with the urge to have a bowel movement but otherwise no significant changes in her bowels.  No blood in her stools.  She denies any constipation or diarrhea.  No family history of colon cancer, her father had bladder cancer.  Recall she had a history of reflux, was on omeprazole last time I saw her.  She had worked on weight loss and lost 30 pounds, she states this significantly improved her reflux symptoms and she really does not need to take anything at baseline.  She is using low-dose omeprazole 10 mg as needed for pain.  Otherwise denies complaints today, she has been feeling well for the past month.  Her last episode of pelvic pain was end of September.  Endoscopic history: Colonoscopy 11/01/2016 - The perianal and digital rectal examinations were normal. - The  terminal ileum appeared normal. - A 3 mm polyp was found in the ascending colon. The polyp was flat. The polyp was removed with a cold biopsy forceps. Resection and retrieval were complete. - A 3 mm polyp was found in the recto-sigmoid colon. The polyp was sessile. The polyp was removed with a cold biopsy forceps. Resection and retrieval were complete. - A few medium-mouthed diverticula were found in the sigmoid colon. - Internal hemorrhoids were found during retroflexion. The hemorrhoids were small. - The left colon was tortous. The exam was otherwise without abnormality.  Surgical [P], ascending, recto-sigmoid, polyp (2) - HYPERPLASTIC POLYP(S). - THERE IS NO EVIDENCE OF MALIGNANCY.  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   Endometrial hyperplasia 06/2012   GERD (gastroesophageal reflux disease)    Hyperlipidemia    Hypertension    Nonrheumatic mitral valve regurgitation    Personal history of radiation therapy 2011   Right Breast Cancer   PONV (postoperative nausea and vomiting)    Pulmonary hypertension ([place])    Umbilical hernia 02/9416     Past  Surgical History:  Procedure Laterality Date   AUGMENTATION MAMMAPLASTY Right 2005   BREAST ENHANCEMENT SURGERY  2005   implants   BREAST EXCISIONAL BIOPSY Left    BREAST LUMPECTOMY Right 2011   BREAST LUMPECTOMY WITH RADIOACTIVE SEED LOCALIZATION Left 04/20/2018   Procedure: LEFT BREAST LUMPECTOMY WITH RADIOACTIVE SEED LOCALIZATION;  Surgeon: Alphonsa Overall, MD;  Location: [facility];  Service: General;  Laterality: Left;   LAPAROSCOPIC ASSISTED VAGINAL HYSTERECTOMY N/A 07/13/2012   Procedure: LAPAROSCOPIC ASSISTED VAGINAL HYSTERECTOMY;  Surgeon: Margarette Asal, MD;   Location: [facility];  Service: Gynecology;  Laterality: N/A;   UMBILICAL HERNIA REPAIR     Family History  Problem Relation Age of Onset   Stroke Mother    Hypertension Mother    Breast cancer Mother    Heart disease Mother    Hyperlipidemia Mother    Bladder Cancer Father        mets   Social History   Tobacco Use   Smoking status: Never   Smokeless tobacco: Never  Vaping Use   Vaping Use: Never used  Substance Use Topics   Alcohol use: Yes    Alcohol/week: 2.0 - 3.0 standard drinks of alcohol    Types: 2 - 3 Standard drinks or equivalent per week    Comment: occasionally   Drug use: No   Current Outpatient Medications  Medication Sig Dispense Refill   ALPRAZolam (XANAX) 0.25 MG tablet Take 1 tablet (0.25 mg total) by mouth daily as needed for anxiety. 30 tablet 0   cetirizine (ZYRTEC) 10 MG tablet TAKE 1 TABLET(10 MG) BY MOUTH DAILY 30 tablet 0   lisinopril (PRINIVIL,ZESTRIL) 10 MG tablet Take 0.5 tablets (5 mg total) by mouth daily. 45 tablet 0   omeprazole (PRILOSEC) 10 MG capsule Take 10 mg by mouth daily.     nitroGLYCERIN (NITROSTAT) 0.4 MG SL tablet Place 1 tablet (0.4 mg total) under the tongue every 5 (five) minutes as needed for chest pain. 10 tablet 1   No current facility-administered medications for this visit.   Allergies  Allergen Reactions   Sulfa Antibiotics Nausea Only   Codeine Other (See Comments)    Unknown. Can take Percocet.   Penicillins Other (See Comments)    Unknown childhood reaction   Sulfonamide Derivatives Nausea And Vomiting   Sulfasalazine Nausea And Vomiting     Review of Systems: All systems reviewed and negative except where noted in HPI.   No recent labs on file  Physical Exam: BP 110/80   Pulse 72   Ht 5' 6.5" (1.689 m)   Wt 183 lb 6 oz (83.2 kg)   LMP 06/23/2012   BMI 29.15 kg/m  Constitutional: Pleasant,well-developed, female in no acute distress. HEENT: Normocephalic and atraumatic. Conjunctivae are normal. No  scleral icterus. Neck supple.  Cardiovascular: Normal rate, regular rhythm.  Pulmonary/chest: Effort normal and breath sounds normal. No wheezing, rales or rhonchi. Abdominal: Soft, nondistended, nontender. There are no masses palpable.  Extremities: no edema Lymphadenopathy: No cervical adenopathy noted. Neurological: Alert and oriented to person place and time. Skin: Skin is warm and dry. No rashes noted. Psychiatric: Normal mood and affect. Behavior is normal.   ASSESSMENT: 51 y.o. female here for assessment of the following  1. Lower abdominal pain   2. History of colon polyps   3. Gastroesophageal reflux disease, unspecified whether esophagitis present    2 episodes of mid lower abdominal pain/pelvic pain in recent months, that lasted several days.  She was  quite tender to palpation, had tenderness with intercourse and gynecologic exam.  Reportedly had blood work and a CT scan that was unrevealing, cystoscopy unrevealing.  She did not really have any significant changes in her bowels and based on description of symptoms with reproducible pelvic pain I think less likely related to her GI tract.  We had discussed that she has a history of a very small sessile serrated polyp in her right colon 5 and half years ago.  Current guidelines recommend follow-up colonoscopy anywhere from 5 to 7 years.  Given her recent symptoms she is interested in pursuing a surveillance colonoscopy at this time.  I think that is reasonable although I think it may be unlikely to find a clear source for her symptoms on colonoscopy, it seems less likely related to her bowel.  She has been doing better lately in regards to the symptoms, she wants to pursue a colonoscopy.  Following discussion of risk and benefits she wants to proceed.  In the interim we will get results of her CT scan from urology to confirm no concerning process and review her bowel.  We will also get baseline labs from her primary care doctor  Virgina Jock.  Otherwise, history of reflux, well controlled at present time following significant weight loss.  She was counseled on GERD, risks of long-term PPI use.  Using omeprazole as needed and minimally, do not feel that she needs EGD at this point in time.  PLAN: - schedule colonoscopy at the [facility] for polyps. Unclear what caused her prior pelvic pain, I think less likely to be related to her bowel / colon based on history - get CT result from Dr. Claudia Desanctis [facility] - get labs from Dr. Virgina Jock - not using PPI, lost weight, doing better from reflux perspective, can use low dose omeprazole PRN in the future.   Jolly Mango, MD [facility]  CC: Shon Baton, MD

## 2022-02-27 ENCOUNTER — Telehealth: Payer: Self-pay | Admitting: Gastroenterology

## 2022-02-27 NOTE — Telephone Encounter (Signed)
Labs arrived from Dr. Keane Police office:  09/21/21: - normal LFTs, renal function - Hgb 12.6, MCV 87, plt 195, WBC 3.9 - TSH 1.14   Will await pending colonoscopy

## 2022-02-27 NOTE — Telephone Encounter (Signed)
CT result arrived from Dr. 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

## 2022-03-25 ENCOUNTER — Encounter: Payer: Self-pay | Admitting: Gastroenterology

## 2022-03-25 ENCOUNTER — Ambulatory Visit (AMBULATORY_SURGERY_CENTER): Payer: BC Managed Care – PPO | Admitting: Gastroenterology

## 2022-03-25 VITALS — BP 131/77 | HR 57 | Temp 97.8°F | Resp 11 | Ht 66.0 in | Wt 183.0 lb

## 2022-03-25 DIAGNOSIS — Z86010 Personal history of colonic polyps: Secondary | ICD-10-CM

## 2022-03-25 DIAGNOSIS — K635 Polyp of colon: Secondary | ICD-10-CM | POA: Diagnosis not present

## 2022-03-25 DIAGNOSIS — D122 Benign neoplasm of ascending colon: Secondary | ICD-10-CM

## 2022-03-25 DIAGNOSIS — Z1211 Encounter for screening for malignant neoplasm of colon: Secondary | ICD-10-CM | POA: Diagnosis not present

## 2022-03-25 DIAGNOSIS — R1030 Lower abdominal pain, unspecified: Secondary | ICD-10-CM

## 2022-03-25 DIAGNOSIS — Z09 Encounter for follow-up examination after completed treatment for conditions other than malignant neoplasm: Secondary | ICD-10-CM

## 2022-03-25 MED ORDER — SODIUM CHLORIDE 0.9 % IV SOLN: 500.0000 mL | Freq: Once | INTRAVENOUS | Status: AC

## 2022-03-25 NOTE — Progress Notes (Signed)
Pt's states no medical or surgical changes since previsit or office visit. VS assessed by D.T 

## 2022-03-25 NOTE — Op Note (Signed)
[place] Patient Name: Cassandra Mccann Procedure Date: 03/25/2022 3:23 PM MRN: [id number] Endoscopist: Remo Lipps P. Havery Moros , MD, [id number] Age: 51 Referring MD:  Date of Birth: [birth year] Gender: Female Account #: [id number] Procedure:                Colonoscopy Indications:              High risk colon cancer surveillance: Personal                            history of colonic polyps - right sided serrated                            lesion removed 10/2016, history of abdomino/pelvic                            pain with negative imaging Medicines:                Monitored Anesthesia Care Procedure:                Pre-Anesthesia Assessment:                           - Prior to the procedure, a History and Physical                            was performed, and patient medications and                            allergies were reviewed. The patient's tolerance of                            previous anesthesia was also reviewed. The risks                            and benefits of the procedure and the sedation                            options and risks were discussed with the patient.                            All questions were answered, and informed consent                            was obtained. Prior Anticoagulants: The patient has                            taken no anticoagulant or antiplatelet agents. ASA                            Grade Assessment: II - A patient with mild systemic                            disease. After reviewing the risks and benefits,  the patient was deemed in satisfactory condition to                            undergo the procedure.                           After obtaining informed consent, the colonoscope                            was passed under direct vision. Throughout the                            procedure, the patient's blood pressure, pulse, and                            oxygen saturations were monitored  continuously. The                            Olympus PCF-[id number] (#[id number]) Colonoscope was                            introduced through the anus and advanced to the the                            terminal ileum, with identification of the                            appendiceal orifice and IC valve. The colonoscopy                            was performed without difficulty. The patient                            tolerated the procedure well. The quality of the                            bowel preparation was good. The terminal ileum,                            ileocecal valve, appendiceal orifice, and rectum                            were photographed. Scope In: 3:29:23 PM Scope Out: 3:51:05 PM Scope Withdrawal Time: 0 hours 15 minutes 43 seconds  Total Procedure Duration: 0 hours 21 minutes 42 seconds  Findings:                 The perianal and digital rectal examinations were                            normal.                           The terminal ileum appeared normal.  A 3 mm polyp was found in the ascending colon. The                            polyp was flat. The polyp was removed with a cold                            snare. Resection and retrieval were complete.                           A few small-mouthed diverticula were found in the                            sigmoid colon.                           Anal papilla(e) were hypertrophied.                           The colon was tortous. The exam was otherwise                            without abnormality. Complications:            No immediate complications. Estimated blood loss:                            Minimal. Estimated Blood Loss:     Estimated blood loss was minimal. Impression:               - The examined portion of the ileum was normal.                           - One 3 mm polyp in the ascending colon, removed                            with a cold snare. Resected and retrieved.                            - Diverticulosis in the sigmoid colon.                           - Anal papilla(e) were hypertrophied.                           - Tortous colon                           - The examination was otherwise normal.                           No cause for prior episodes of abominal / pelvic                            pain on this exam. Recommendation:           - Patient has a contact number available for  emergencies. The signs and symptoms of potential                            delayed complications were discussed with the                            patient. Return to normal activities tomorrow.                            Written discharge instructions were provided to the                            patient.                           - Resume previous diet.                           - Continue present medications.                           - Await pathology results. Remo Lipps P. Tynesha Free, MD 03/25/2022 3:56:01 PM This report has been signed electronically.

## 2022-03-25 NOTE — Progress Notes (Signed)
Called to room to assist during endoscopic procedure.  Patient ID and intended procedure confirmed with present staff. Received instructions for my participation in the procedure from the performing physician.  

## 2022-03-25 NOTE — Progress Notes (Signed)
[facility] History and Physical   Primary Care Physician:  Shon Baton, MD   Reason for Procedure:   History of colon polyps, history of abdominal pain  Plan:    colonoscopy     HPI: Cassandra Mccann is a 51 y.o. female  here for colonoscopy surveillance - right sided polyp removed 10/2016, had recommended a 5 year follow up.. Patient denies any bowel symptoms at this time but has had some lower abdominal pain episodes, CT negative, unclear etiology, felt to be more pelvic in location. She has had 2 episodes, no recurrence since I have seen her. No family history of colon cancer known. Otherwise feels well without any cardiopulmonary symptoms.   I have discussed risks / benefits of anesthesia and endoscopic procedure with Derry Skill and they wish to proceed with the exams as outlined today.    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   Endometrial hyperplasia 06/2012   GERD (gastroesophageal reflux disease)    Hyperlipidemia    Hypertension    Nonrheumatic mitral valve regurgitation    Personal history of radiation therapy 2011   Right Breast Cancer   PONV (postoperative nausea and vomiting)    Pulmonary hypertension ([place])    Umbilical hernia [id number]/5366    Past Surgical History:  Procedure Laterality Date   AUGMENTATION MAMMAPLASTY Right 2005   BREAST ENHANCEMENT SURGERY  2005   implants   BREAST EXCISIONAL BIOPSY Left    BREAST LUMPECTOMY Right 2011   BREAST LUMPECTOMY WITH RADIOACTIVE SEED LOCALIZATION Left 04/20/2018   Procedure: LEFT BREAST LUMPECTOMY WITH RADIOACTIVE SEED LOCALIZATION;  Surgeon: Alphonsa Overall, MD;  Location: [facility];  Service: General;  Laterality: Left;   LAPAROSCOPIC ASSISTED VAGINAL HYSTERECTOMY N/A 07/13/2012   Procedure: LAPAROSCOPIC ASSISTED VAGINAL HYSTERECTOMY;  Surgeon: Margarette Asal, MD;  Location: [facility];  Service: Gynecology;  Laterality: N/A;   UMBILICAL HERNIA REPAIR      Prior to Admission medications   Medication Sig Start Date End Date Taking? Authorizing Provider  cetirizine (ZYRTEC) 10 MG tablet TAKE 1 TABLET(10 MG) BY MOUTH DAILY 02/23/16  Yes Jaynee Eagles, PA-C  lisinopril (PRINIVIL,ZESTRIL) 10 MG tablet Take 0.5 tablets (5 mg total) by mouth daily. 12/22/15  Yes Bedsole, Amy E, MD  ALPRAZolam (XANAX) 0.25 MG tablet Take 1 tablet (0.25 mg total) by mouth daily as needed for anxiety. 04/21/15   Bedsole, Amy E, MD  nitroGLYCERIN (NITROSTAT) 0.4 MG SL tablet Place 1 tablet (0.4 mg total) under the tongue every 5 (five) minutes as needed for chest pain. 11/29/20 02/27/21  Patwardhan, Reynold Bowen, MD  omeprazole (PRILOSEC) 10 MG capsule Take 10 mg by mouth daily. Patient not taking: Reported on 03/25/2022    [provider]    Current Outpatient Medications  Medication Sig Dispense Refill   cetirizine (ZYRTEC) 10 MG tablet TAKE 1 TABLET(10 MG) BY MOUTH DAILY 30 tablet 0   lisinopril (PRINIVIL,ZESTRIL) 10 MG tablet Take 0.5 tablets (5 mg total) by mouth daily. 45 tablet 0   ALPRAZolam (XANAX) 0.25 MG tablet Take 1 tablet (0.25 mg total) by mouth daily as needed for anxiety. 30 tablet 0   nitroGLYCERIN (NITROSTAT) 0.4 MG SL tablet Place 1 tablet (0.4 mg total) under the tongue every 5 (five) minutes as needed for chest  pain. 10 tablet 1   omeprazole (PRILOSEC) 10 MG capsule Take 10 mg by mouth daily. (Patient not taking: Reported on 03/25/2022)     Current Facility-Administered Medications  Medication Dose Route Frequency Provider Last Rate Last Admin   0.9 %  sodium chloride infusion  500 mL Intravenous Once Abbi Mancini, Carlota Raspberry, MD        Allergies as of 03/25/2022 - Review Complete 03/25/2022  Allergen Reaction Noted   Sulfa antibiotics Nausea Only 01/21/2019   Codeine Other (See Comments) 09/05/2010   Penicillins Other (See Comments) 03/06/2009    Sulfonamide derivatives Nausea And Vomiting 03/06/2009   Sulfasalazine Nausea And Vomiting 03/06/2009    Family History  Problem Relation Age of Onset   Stroke Mother    Hypertension Mother    Breast cancer Mother    Heart disease Mother    Hyperlipidemia Mother    Bladder Cancer Father        mets    Social History   Socioeconomic History   Marital status: Married    Spouse name: Not on file   Number of children: 2   Years of education: Not on file   Highest education level: Not on file  Occupational History   Occupation: dental hygenist  Tobacco Use   Smoking status: Never   Smokeless tobacco: Never  Vaping Use   Vaping Use: Never used  Substance and Sexual Activity   Alcohol use: Yes    Alcohol/week: 2.0 - 3.0 standard drinks of alcohol    Types: 2 - 3 Standard drinks or equivalent per week    Comment: occasionally   Drug use: No   Sexual activity: Yes    Birth control/protection: Surgical    Comment: Hysterectomy and Husband vasectomy  Other Topics Concern   Not on file  Social History Narrative   Not on file   Social Determinants of Health   Financial Resource Strain: Not on file  Food Insecurity: Not on file  Transportation Needs: Not on file  Physical Activity: Not on file  Stress: Not on file  Social Connections: Not on file  Intimate Partner Violence: Not on file    Review of Systems: All other review of systems negative except as mentioned in the HPI.  Physical Exam: Vital signs BP 120/70   Pulse 60   Temp 97.8 F (36.6 C) (Skin)   Ht '5\' 6"'$  (1.676 m)   Wt 183 lb (83 kg)   LMP 06/23/2012   SpO2 100%   BMI 29.54 kg/m   General:   Alert,  Well-developed, pleasant and cooperative in NAD Lungs:  Clear throughout to auscultation.   Heart:  Regular rate and rhythm Abdomen:  Soft, nontender and nondistended.   Neuro/Psych:  Alert and cooperative. Normal mood and affect. A and O x 3  Jolly Mango, MD [facility]

## 2022-03-25 NOTE — Progress Notes (Signed)
To pacu, VSS. Report to [email or web address] 

## 2022-03-25 NOTE — Patient Instructions (Signed)
Impression/Recommendations:  Polyp and diverticulosis handouts given to patient.  Resume previous diet. Continue present medications. Await pathology results.  YOU HAD AN ENDOSCOPIC PROCEDURE TODAY AT [facility]:   Refer to the procedure report that was given to you for any specific questions about what was found during the examination.  If the procedure report does not answer your questions, please call your gastroenterologist to clarify.  If you requested that your care partner not be given the details of your procedure findings, then the procedure report has been included in a sealed envelope for you to review at your convenience later.  YOU SHOULD EXPECT: Some feelings of bloating in the abdomen. Passage of more gas than usual.  Walking can help get rid of the air that was put into your GI tract during the procedure and reduce the bloating. If you had a lower endoscopy (such as a colonoscopy or flexible sigmoidoscopy) you may notice spotting of blood in your stool or on the toilet paper. If you underwent a bowel prep for your procedure, you may not have a normal bowel movement for a few days.  Please Note:  You might notice some irritation and congestion in your nose or some drainage.  This is from the oxygen used during your procedure.  There is no need for concern and it should clear up in a day or so.  SYMPTOMS TO REPORT IMMEDIATELY:  Following lower endoscopy (colonoscopy or flexible sigmoidoscopy):  Excessive amounts of blood in the stool  Significant tenderness or worsening of abdominal pains  Swelling of the abdomen that is new, acute  Fever of 100F or higher For urgent or emergent issues, a gastroenterologist can be reached at any hour by calling [phone]. Do not use MyChart messaging for urgent concerns.    DIET:  We do recommend a small meal at first, but then you may proceed to your regular diet.  Drink plenty of fluids but you should avoid alcoholic  beverages for 24 hours.  ACTIVITY:  You should plan to take it easy for the rest of today and you should NOT DRIVE or use heavy machinery until tomorrow (because of the sedation medicines used during the test).    FOLLOW UP: Our staff will call the number listed on your records the next business day following your procedure.  We will call around 7:15- 8:00 am to check on you and address any questions or concerns that you may have regarding the information given to you following your procedure. If we do not reach you, we will leave a message.     If any biopsies were taken you will be contacted by phone or by letter within the next 1-3 weeks.  Please call us at [phone] if you have not heard about the biopsies in 3 weeks.    SIGNATURES/CONFIDENTIALITY: You and/or your care partner have signed paperwork which will be entered into your electronic medical record.  These signatures attest to the fact that that the information above on your After Visit Summary has been reviewed and is understood.  Full responsibility of the confidentiality of this discharge information lies with you and/or your care-partner.

## 2022-03-26 ENCOUNTER — Telehealth: Payer: Self-pay | Admitting: *Deleted

## 2022-03-26 NOTE — Telephone Encounter (Signed)
  Follow up Call-     03/25/2022    2:38 PM  Call back number  Post procedure Call Back phone  # [phone]  Permission to leave phone message Yes     Patient questions:  Do you have a fever, pain , or abdominal swelling? No. Pain Score  0 *  Have you tolerated food without any problems? Yes.    Have you been able to return to your normal activities? Yes.    Do you have any questions about your discharge instructions: Diet   No. Medications  No. Follow up visit  No.  Do you have questions or concerns about your Care? No.  Actions: * If pain score is 4 or above: No action needed, pain <4.

## 2022-05-15 DIAGNOSIS — L538 Other specified erythematous conditions: Secondary | ICD-10-CM | POA: Diagnosis not present

## 2022-05-15 DIAGNOSIS — D485 Neoplasm of uncertain behavior of skin: Secondary | ICD-10-CM | POA: Diagnosis not present

## 2022-05-15 DIAGNOSIS — D225 Melanocytic nevi of trunk: Secondary | ICD-10-CM | POA: Diagnosis not present

## 2022-05-15 DIAGNOSIS — L570 Actinic keratosis: Secondary | ICD-10-CM | POA: Diagnosis not present

## 2022-09-09 ENCOUNTER — Other Ambulatory Visit: Payer: Self-pay | Admitting: Obstetrics and Gynecology

## 2022-09-09 DIAGNOSIS — Z1231 Encounter for screening mammogram for malignant neoplasm of breast: Secondary | ICD-10-CM

## 2022-09-27 DIAGNOSIS — D649 Anemia, unspecified: Secondary | ICD-10-CM | POA: Diagnosis not present

## 2022-09-30 ENCOUNTER — Ambulatory Visit: Payer: BC Managed Care – PPO

## 2022-10-18 ENCOUNTER — Ambulatory Visit (HOSPITAL_COMMUNITY)
Admission: RE | Admit: 2022-10-18 | Discharge: 2022-10-18 | Disposition: A | Discharge status: Home / Self Care | Payer: 59 | Source: Ambulatory Visit | Attending: Surgery | Admitting: Surgery

## 2022-10-18 ENCOUNTER — Other Ambulatory Visit (HOSPITAL_COMMUNITY): Payer: Self-pay | Admitting: Internal Medicine

## 2022-10-18 DIAGNOSIS — M79661 Pain in right lower leg: Secondary | ICD-10-CM | POA: Insufficient documentation

## 2022-10-21 ENCOUNTER — Ambulatory Visit
Admission: RE | Admit: 2022-10-21 | Discharge: 2022-10-21 | Disposition: A | Discharge status: Home / Self Care | Payer: 59 | Source: Ambulatory Visit | Attending: Obstetrics and Gynecology | Admitting: Obstetrics and Gynecology

## 2022-10-21 DIAGNOSIS — Z1231 Encounter for screening mammogram for malignant neoplasm of breast: Secondary | ICD-10-CM

## 2022-12-20 ENCOUNTER — Encounter (HOSPITAL_BASED_OUTPATIENT_CLINIC_OR_DEPARTMENT_OTHER): Payer: Self-pay

## 2022-12-20 ENCOUNTER — Other Ambulatory Visit (HOSPITAL_BASED_OUTPATIENT_CLINIC_OR_DEPARTMENT_OTHER): Payer: Self-pay

## 2022-12-20 MED ORDER — WEGOVY 0.5 MG/0.5ML ~~LOC~~ SOAJ
0.5000 mg | SUBCUTANEOUS | 3 refills | Status: AC
  Filled 2023-03-09: qty 2, 28d supply, fill #0
  Filled 2023-04-06 – 2023-04-07 (×2): qty 2, 28d supply, fill #1

## 2022-12-20 MED ORDER — WEGOVY 0.25 MG/0.5ML ~~LOC~~ SOAJ
0.2500 mg | SUBCUTANEOUS | 0 refills | Status: AC
  Filled 2022-12-20 – 2023-01-22 (×2): qty 2, 28d supply, fill #0

## 2022-12-24 ENCOUNTER — Other Ambulatory Visit (HOSPITAL_BASED_OUTPATIENT_CLINIC_OR_DEPARTMENT_OTHER): Payer: Self-pay

## 2022-12-27 ENCOUNTER — Other Ambulatory Visit (HOSPITAL_BASED_OUTPATIENT_CLINIC_OR_DEPARTMENT_OTHER): Payer: Self-pay

## 2023-01-01 ENCOUNTER — Other Ambulatory Visit (HOSPITAL_BASED_OUTPATIENT_CLINIC_OR_DEPARTMENT_OTHER): Payer: Self-pay

## 2023-01-02 ENCOUNTER — Other Ambulatory Visit (HOSPITAL_BASED_OUTPATIENT_CLINIC_OR_DEPARTMENT_OTHER): Payer: Self-pay

## 2023-01-03 ENCOUNTER — Other Ambulatory Visit (HOSPITAL_BASED_OUTPATIENT_CLINIC_OR_DEPARTMENT_OTHER): Payer: Self-pay

## 2023-01-06 ENCOUNTER — Other Ambulatory Visit (HOSPITAL_BASED_OUTPATIENT_CLINIC_OR_DEPARTMENT_OTHER): Payer: Self-pay

## 2023-01-19 ENCOUNTER — Other Ambulatory Visit (HOSPITAL_BASED_OUTPATIENT_CLINIC_OR_DEPARTMENT_OTHER): Payer: Self-pay

## 2023-01-20 ENCOUNTER — Other Ambulatory Visit (HOSPITAL_BASED_OUTPATIENT_CLINIC_OR_DEPARTMENT_OTHER): Payer: Self-pay

## 2023-01-22 ENCOUNTER — Other Ambulatory Visit (HOSPITAL_BASED_OUTPATIENT_CLINIC_OR_DEPARTMENT_OTHER): Payer: Self-pay

## 2023-01-23 ENCOUNTER — Other Ambulatory Visit (HOSPITAL_BASED_OUTPATIENT_CLINIC_OR_DEPARTMENT_OTHER): Payer: Self-pay

## 2023-02-14 ENCOUNTER — Other Ambulatory Visit (HOSPITAL_BASED_OUTPATIENT_CLINIC_OR_DEPARTMENT_OTHER): Payer: Self-pay

## 2023-02-14 MED ORDER — WEGOVY 0.5 MG/0.5ML ~~LOC~~ SOAJ
0.5000 mg | SUBCUTANEOUS | 3 refills | Status: AC
  Filled 2023-02-14: qty 2, 28d supply, fill #0
  Filled 2023-06-11: qty 2, 28d supply, fill #1

## 2023-03-09 ENCOUNTER — Other Ambulatory Visit (HOSPITAL_BASED_OUTPATIENT_CLINIC_OR_DEPARTMENT_OTHER): Payer: Self-pay

## 2023-03-10 ENCOUNTER — Other Ambulatory Visit (HOSPITAL_BASED_OUTPATIENT_CLINIC_OR_DEPARTMENT_OTHER): Payer: Self-pay

## 2023-03-10 ENCOUNTER — Other Ambulatory Visit: Payer: Self-pay

## 2023-03-14 ENCOUNTER — Other Ambulatory Visit (HOSPITAL_BASED_OUTPATIENT_CLINIC_OR_DEPARTMENT_OTHER): Payer: Self-pay

## 2023-04-07 ENCOUNTER — Other Ambulatory Visit (HOSPITAL_COMMUNITY): Payer: Self-pay

## 2023-04-07 ENCOUNTER — Other Ambulatory Visit: Payer: Self-pay

## 2023-04-07 ENCOUNTER — Other Ambulatory Visit (HOSPITAL_BASED_OUTPATIENT_CLINIC_OR_DEPARTMENT_OTHER): Payer: Self-pay

## 2023-04-07 MED ORDER — WEGOVY 1 MG/0.5ML ~~LOC~~ SOAJ
1.0000 mg | SUBCUTANEOUS | 3 refills | Status: DC
  Filled 2023-04-07: qty 2, 28d supply, fill #0
  Filled 2023-05-05: qty 2, 28d supply, fill #1
  Filled 2023-06-02 – 2023-06-11 (×2): qty 2, 28d supply, fill #2
  Filled 2023-07-08: qty 2, 28d supply, fill #3

## 2023-05-05 ENCOUNTER — Other Ambulatory Visit (HOSPITAL_COMMUNITY): Payer: Self-pay

## 2023-06-02 ENCOUNTER — Other Ambulatory Visit (HOSPITAL_COMMUNITY): Payer: Self-pay

## 2023-06-11 ENCOUNTER — Other Ambulatory Visit (HOSPITAL_COMMUNITY): Payer: Self-pay

## 2023-07-08 ENCOUNTER — Other Ambulatory Visit (HOSPITAL_COMMUNITY): Payer: Self-pay

## 2023-08-01 ENCOUNTER — Other Ambulatory Visit (HOSPITAL_COMMUNITY): Payer: Self-pay

## 2023-08-04 ENCOUNTER — Other Ambulatory Visit (HOSPITAL_COMMUNITY): Payer: Self-pay

## 2023-08-05 ENCOUNTER — Other Ambulatory Visit (HOSPITAL_COMMUNITY): Payer: Self-pay

## 2023-08-05 MED ORDER — WEGOVY 1 MG/0.5ML ~~LOC~~ SOAJ
1.0000 mg | SUBCUTANEOUS | 3 refills | Status: AC
  Filled 2023-08-05: qty 2, 28d supply, fill #0

## 2023-08-07 ENCOUNTER — Other Ambulatory Visit (HOSPITAL_COMMUNITY): Payer: Self-pay

## 2023-08-07 MED ORDER — WEGOVY 1 MG/0.5ML ~~LOC~~ SOAJ
1.0000 mg | SUBCUTANEOUS | 3 refills | Status: AC
  Filled 2023-08-07: qty 2, 28d supply, fill #0

## 2023-08-08 ENCOUNTER — Other Ambulatory Visit (HOSPITAL_COMMUNITY): Payer: Self-pay

## 2023-08-08 MED ORDER — WEGOVY 1.7 MG/0.75ML ~~LOC~~ SOAJ
SUBCUTANEOUS | 3 refills | Status: DC
  Filled 2023-08-08: qty 3, 28d supply, fill #0
  Filled 2023-08-29: qty 3, 28d supply, fill #1
  Filled 2023-09-29: qty 3, 28d supply, fill #2
  Filled 2023-10-28: qty 3, 28d supply, fill #3

## 2023-08-12 ENCOUNTER — Other Ambulatory Visit (HOSPITAL_COMMUNITY): Payer: Self-pay

## 2023-08-29 ENCOUNTER — Other Ambulatory Visit (HOSPITAL_COMMUNITY): Payer: Self-pay

## 2023-09-08 ENCOUNTER — Other Ambulatory Visit: Payer: Self-pay | Admitting: Obstetrics and Gynecology

## 2023-09-08 DIAGNOSIS — Z1231 Encounter for screening mammogram for malignant neoplasm of breast: Secondary | ICD-10-CM

## 2023-09-29 ENCOUNTER — Other Ambulatory Visit (HOSPITAL_COMMUNITY): Payer: Self-pay

## 2023-10-28 ENCOUNTER — Other Ambulatory Visit (HOSPITAL_COMMUNITY): Payer: Self-pay

## 2023-11-10 ENCOUNTER — Ambulatory Visit
Admission: RE | Admit: 2023-11-10 | Discharge: 2023-11-10 | Disposition: A | Discharge status: Home / Self Care | Source: Ambulatory Visit | Attending: Obstetrics and Gynecology | Admitting: Obstetrics and Gynecology

## 2023-11-10 DIAGNOSIS — Z1231 Encounter for screening mammogram for malignant neoplasm of breast: Secondary | ICD-10-CM

## 2023-12-03 ENCOUNTER — Other Ambulatory Visit (HOSPITAL_COMMUNITY): Payer: Self-pay

## 2023-12-04 ENCOUNTER — Other Ambulatory Visit (HOSPITAL_COMMUNITY): Payer: Self-pay

## 2023-12-04 ENCOUNTER — Other Ambulatory Visit (HOSPITAL_BASED_OUTPATIENT_CLINIC_OR_DEPARTMENT_OTHER): Payer: Self-pay

## 2023-12-04 MED ORDER — WEGOVY 1.7 MG/0.75ML ~~LOC~~ SOAJ
1.7000 mg | SUBCUTANEOUS | 3 refills | Status: DC
  Filled 2023-12-04: qty 3, 28d supply, fill #0
  Filled 2023-12-30: qty 3, 28d supply, fill #1
  Filled 2024-01-23: qty 3, 28d supply, fill #2
  Filled 2024-02-23: qty 3, 28d supply, fill #3

## 2023-12-04 MED ORDER — WEGOVY 1.7 MG/0.75ML ~~LOC~~ SOAJ
1.7000 mg | SUBCUTANEOUS | 3 refills | Status: AC
  Filled 2023-12-04: qty 3, 28d supply, fill #0

## 2023-12-05 ENCOUNTER — Other Ambulatory Visit (HOSPITAL_COMMUNITY): Payer: Self-pay

## 2023-12-30 ENCOUNTER — Other Ambulatory Visit (HOSPITAL_COMMUNITY): Payer: Self-pay

## 2024-03-19 ENCOUNTER — Other Ambulatory Visit (HOSPITAL_COMMUNITY): Payer: Self-pay

## 2024-03-19 MED ORDER — WEGOVY 1.7 MG/0.75ML ~~LOC~~ SOAJ
1.7000 mg | SUBCUTANEOUS | 3 refills | Status: AC
  Filled 2024-03-19: qty 3, 28d supply, fill #0

## 2024-03-19 MED ORDER — WEGOVY 1.7 MG/0.75ML ~~LOC~~ SOAJ
1.7000 mg | SUBCUTANEOUS | 3 refills | Status: AC
  Filled 2024-04-19: qty 3, 28d supply, fill #0
  Filled 2024-05-19: qty 3, 28d supply, fill #1

## 2024-03-22 ENCOUNTER — Other Ambulatory Visit (HOSPITAL_COMMUNITY): Payer: Self-pay

## 2024-04-19 ENCOUNTER — Other Ambulatory Visit (HOSPITAL_COMMUNITY): Payer: Self-pay

## 2024-05-19 ENCOUNTER — Other Ambulatory Visit (HOSPITAL_COMMUNITY): Payer: Self-pay
